# Patient Record
Sex: Female | Born: 1970 | Race: Black or African American | Hispanic: No | Marital: Married | Smoking: Never smoker
Health system: Southern US, Community
[De-identification: ages and names within clinical notes are randomized; demographics above are authoritative.]

## PROBLEM LIST (undated history)

## (undated) DIAGNOSIS — A630 Anogenital (venereal) warts: Secondary | ICD-10-CM

## (undated) DIAGNOSIS — R030 Elevated blood-pressure reading, without diagnosis of hypertension: Secondary | ICD-10-CM

## (undated) DIAGNOSIS — E042 Nontoxic multinodular goiter: Secondary | ICD-10-CM

## (undated) DIAGNOSIS — Q7649 Other congenital malformations of spine, not associated with scoliosis: Secondary | ICD-10-CM

## (undated) DIAGNOSIS — Z8619 Personal history of other infectious and parasitic diseases: Secondary | ICD-10-CM

## (undated) DIAGNOSIS — I10 Essential (primary) hypertension: Secondary | ICD-10-CM

## (undated) DIAGNOSIS — Z531 Procedure and treatment not carried out because of patient's decision for reasons of belief and group pressure: Secondary | ICD-10-CM

## (undated) DIAGNOSIS — J302 Other seasonal allergic rhinitis: Secondary | ICD-10-CM

## (undated) DIAGNOSIS — Z8669 Personal history of other diseases of the nervous system and sense organs: Secondary | ICD-10-CM

## (undated) DIAGNOSIS — T7840XA Allergy, unspecified, initial encounter: Secondary | ICD-10-CM

## (undated) HISTORY — DX: Personal history of other diseases of the nervous system and sense organs: Z86.69

## (undated) HISTORY — DX: Other congenital malformations of spine, not associated with scoliosis: Q76.49

## (undated) HISTORY — DX: Elevated blood-pressure reading, without diagnosis of hypertension: R03.0

## (undated) HISTORY — DX: Anogenital (venereal) warts: A63.0

## (undated) HISTORY — DX: Essential (primary) hypertension: I10

## (undated) HISTORY — DX: Nontoxic multinodular goiter: E04.2

## (undated) HISTORY — PX: WISDOM TOOTH EXTRACTION: SHX21

## (undated) HISTORY — DX: Allergy, unspecified, initial encounter: T78.40XA

## (undated) HISTORY — DX: Other seasonal allergic rhinitis: J30.2

## (undated) HISTORY — DX: Personal history of other infectious and parasitic diseases: Z86.19

## (undated) HISTORY — PX: TUBAL LIGATION: SHX77

---
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
## 2001-09-30 ENCOUNTER — Other Ambulatory Visit: Admission: RE | Admit: 2001-09-30 | Discharge: 2001-09-30 | Payer: Self-pay | Admitting: *Deleted

## 2002-10-08 ENCOUNTER — Other Ambulatory Visit: Admission: RE | Admit: 2002-10-08 | Discharge: 2002-10-08 | Payer: Self-pay | Admitting: Obstetrics & Gynecology

## 2003-08-26 ENCOUNTER — Inpatient Hospital Stay (HOSPITAL_COMMUNITY): Admission: AD | Admit: 2003-08-26 | Discharge: 2003-08-27 | Payer: Self-pay | Admitting: Obstetrics and Gynecology

## 2003-09-12 ENCOUNTER — Encounter (INDEPENDENT_AMBULATORY_CARE_PROVIDER_SITE_OTHER): Payer: Self-pay

## 2003-09-12 ENCOUNTER — Inpatient Hospital Stay (HOSPITAL_COMMUNITY): Admission: AD | Admit: 2003-09-12 | Discharge: 2003-09-14 | Payer: Self-pay | Admitting: Obstetrics and Gynecology

## 2003-09-15 ENCOUNTER — Encounter: Admission: RE | Admit: 2003-09-15 | Discharge: 2003-10-15 | Payer: Self-pay | Admitting: Obstetrics and Gynecology

## 2003-10-16 ENCOUNTER — Encounter: Admission: RE | Admit: 2003-10-16 | Discharge: 2003-11-15 | Payer: Self-pay | Admitting: Obstetrics and Gynecology

## 2003-11-14 ENCOUNTER — Other Ambulatory Visit: Admission: RE | Admit: 2003-11-14 | Discharge: 2003-11-14 | Payer: Self-pay | Admitting: Obstetrics and Gynecology

## 2003-12-16 ENCOUNTER — Encounter: Admission: RE | Admit: 2003-12-16 | Discharge: 2004-01-15 | Payer: Self-pay | Admitting: Obstetrics and Gynecology

## 2004-01-16 ENCOUNTER — Encounter: Admission: RE | Admit: 2004-01-16 | Discharge: 2004-02-15 | Payer: Self-pay | Admitting: Obstetrics and Gynecology

## 2004-03-15 ENCOUNTER — Encounter: Admission: RE | Admit: 2004-03-15 | Discharge: 2004-04-14 | Payer: Self-pay | Admitting: Obstetrics and Gynecology

## 2004-07-31 ENCOUNTER — Ambulatory Visit: Payer: Self-pay | Admitting: Family Medicine

## 2005-03-25 ENCOUNTER — Encounter: Admission: RE | Admit: 2005-03-25 | Discharge: 2005-03-25 | Payer: Self-pay | Admitting: Obstetrics and Gynecology

## 2005-09-24 ENCOUNTER — Emergency Department (HOSPITAL_COMMUNITY): Admission: EM | Admit: 2005-09-24 | Discharge: 2005-09-24 | Payer: Self-pay | Admitting: Family Medicine

## 2005-09-30 ENCOUNTER — Emergency Department (HOSPITAL_COMMUNITY): Admission: EM | Admit: 2005-09-30 | Discharge: 2005-09-30 | Payer: Self-pay | Admitting: Family Medicine

## 2006-11-21 ENCOUNTER — Ambulatory Visit (HOSPITAL_COMMUNITY): Admission: RE | Admit: 2006-11-21 | Discharge: 2006-11-21 | Payer: Self-pay | Admitting: Internal Medicine

## 2008-05-05 ENCOUNTER — Encounter: Admission: RE | Admit: 2008-05-05 | Discharge: 2008-05-05 | Payer: Self-pay | Admitting: Internal Medicine

## 2008-05-23 ENCOUNTER — Emergency Department (HOSPITAL_COMMUNITY): Admission: EM | Admit: 2008-05-23 | Discharge: 2008-05-23 | Payer: Self-pay | Admitting: Emergency Medicine

## 2010-04-18 ENCOUNTER — Ambulatory Visit (HOSPITAL_COMMUNITY): Admission: RE | Admit: 2010-04-18 | Discharge: 2010-04-18 | Payer: Self-pay | Admitting: Obstetrics and Gynecology

## 2010-05-09 ENCOUNTER — Ambulatory Visit (HOSPITAL_COMMUNITY): Admission: RE | Admit: 2010-05-09 | Discharge: 2010-05-09 | Payer: Self-pay | Admitting: Obstetrics and Gynecology

## 2010-06-27 ENCOUNTER — Ambulatory Visit (HOSPITAL_COMMUNITY): Admission: RE | Admit: 2010-06-27 | Discharge: 2010-06-27 | Payer: Self-pay | Admitting: Obstetrics and Gynecology

## 2010-08-01 ENCOUNTER — Ambulatory Visit (HOSPITAL_COMMUNITY): Admission: RE | Admit: 2010-08-01 | Discharge: 2010-08-01 | Payer: Self-pay | Admitting: Obstetrics and Gynecology

## 2010-09-20 ENCOUNTER — Encounter (INDEPENDENT_AMBULATORY_CARE_PROVIDER_SITE_OTHER): Payer: Self-pay | Admitting: Obstetrics and Gynecology

## 2010-09-20 ENCOUNTER — Inpatient Hospital Stay (HOSPITAL_COMMUNITY): Admission: AD | Admit: 2010-09-20 | Discharge: 2010-09-22 | Payer: Self-pay | Admitting: Obstetrics and Gynecology

## 2010-09-25 ENCOUNTER — Encounter
Admission: RE | Admit: 2010-09-25 | Discharge: 2010-10-25 | Payer: Self-pay | Source: Home / Self Care | Admitting: Obstetrics and Gynecology

## 2010-10-26 ENCOUNTER — Encounter
Admission: RE | Admit: 2010-10-26 | Discharge: 2010-11-25 | Payer: Self-pay | Source: Home / Self Care | Attending: Obstetrics and Gynecology | Admitting: Obstetrics and Gynecology

## 2010-11-06 ENCOUNTER — Encounter
Admission: RE | Admit: 2010-11-06 | Discharge: 2010-11-06 | Payer: Self-pay | Source: Home / Self Care | Attending: Obstetrics and Gynecology | Admitting: Obstetrics and Gynecology

## 2010-11-26 ENCOUNTER — Encounter
Admission: RE | Admit: 2010-11-26 | Discharge: 2010-12-25 | Payer: Self-pay | Source: Home / Self Care | Attending: Obstetrics and Gynecology | Admitting: Obstetrics and Gynecology

## 2010-12-16 ENCOUNTER — Encounter: Payer: Self-pay | Admitting: Obstetrics and Gynecology

## 2010-12-27 ENCOUNTER — Encounter (HOSPITAL_COMMUNITY)
Admission: RE | Admit: 2010-12-27 | Discharge: 2010-12-27 | Disposition: A | Discharge status: Home / Self Care | Payer: Federal, State, Local not specified - PPO | Source: Ambulatory Visit | Attending: Obstetrics and Gynecology | Admitting: Obstetrics and Gynecology

## 2010-12-27 DIAGNOSIS — O923 Agalactia: Secondary | ICD-10-CM | POA: Insufficient documentation

## 2011-02-06 LAB — CBC
Hemoglobin: 10.4 g/dL — ABNORMAL LOW (ref 12.0–15.0)
Platelets: 223 10*3/uL (ref 150–400)
RBC: 3.82 MIL/uL — ABNORMAL LOW (ref 3.87–5.11)
RBC: 4.27 MIL/uL (ref 3.87–5.11)
WBC: 9 10*3/uL (ref 4.0–10.5)

## 2011-02-06 LAB — URIC ACID: Uric Acid, Serum: 3.8 mg/dL (ref 2.4–7.0)

## 2011-02-06 LAB — COMPREHENSIVE METABOLIC PANEL
ALT: 22 U/L (ref 0–35)
AST: 35 U/L (ref 0–37)
CO2: 23 mEq/L (ref 19–32)
Chloride: 105 mEq/L (ref 96–112)
GFR calc Af Amer: >60 mL/min (ref >60)
GFR calc non Af Amer: >60 mL/min (ref >60)
Sodium: 134 mEq/L — ABNORMAL LOW (ref 135–145)
Total Bilirubin: 0.7 mg/dL (ref 0.3–1.2)

## 2011-02-06 LAB — RPR: RPR Ser Ql: NONREACTIVE

## 2011-02-06 LAB — RUBELLA SCREEN: Rubella: 3.1 IU/mL

## 2011-02-25 ENCOUNTER — Encounter (HOSPITAL_COMMUNITY)
Admission: RE | Admit: 2011-02-25 | Discharge: 2011-02-25 | Disposition: A | Discharge status: Home / Self Care | Payer: Federal, State, Local not specified - PPO | Source: Ambulatory Visit | Attending: Obstetrics and Gynecology | Admitting: Obstetrics and Gynecology

## 2011-02-25 DIAGNOSIS — O923 Agalactia: Secondary | ICD-10-CM | POA: Insufficient documentation

## 2011-03-28 ENCOUNTER — Encounter (HOSPITAL_COMMUNITY)
Admission: RE | Admit: 2011-03-28 | Discharge: 2011-03-28 | Disposition: A | Discharge status: Home / Self Care | Payer: Federal, State, Local not specified - PPO | Source: Ambulatory Visit | Attending: Obstetrics and Gynecology | Admitting: Obstetrics and Gynecology

## 2011-03-28 DIAGNOSIS — O923 Agalactia: Secondary | ICD-10-CM | POA: Insufficient documentation
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
## 2011-04-12 NOTE — H&P (Signed)
   NAME:  Jodi Young, Jodi Young                       ACCOUNT NO.:  [id number]   MEDICAL RECORD NO.:  [id number]                   PATIENT TYPE:  MAT   LOCATION:  [facility]                                 FACILITY:  [facility]   PHYSICIAN:  Lenoard Aden, M.D.             DATE OF BIRTH:  10/10/1971   DATE OF ADMISSION:  09/12/2003  DATE OF DISCHARGE:                                HISTORY & PHYSICAL   CHIEF COMPLAINT:  Spontaneous rupture of membranes at 4 a.m.   HISTORY OF PRESENT ILLNESS:  The patient is a 40 year old African-American  female, G1, P0, EDD of October 19, 2003, who presents at 34-5/7 weeks, who  presents with spontaneous rupture of membranes at 4 a.m.   PAST MEDICAL HISTORY:  Remarkable for cryosurgery, history of HPV,  questionable history of a fibroid, history of cone biopsy.  No other medical  or surgical hospitalizations noted.  Family history of insulin-dependent  diabetes.  Personal history of nephrolithiasis and migraine headaches.  History of hypertension.   PRENATAL LABORATORY DATA:  Blood type is O positive, Rh antibody negative.  Rubella immune.  Hemoglobin electrophoresis within normal limits.   The patient has presented approximately at 31-32 weeks with preterm labor.  She has received betamethasone on, I believe, October 4 and October 5.   PHYSICAL EXAMINATION:  GENERAL:  She was an uncomfortable-appearing African-  American female in no acute distress.  VITAL SIGNS:  Blood pressure 150/112.  HEENT:  Normal.  CHEST:  Lungs clear.  CARDIAC:  Regular rhythm.  ABDOMEN:  Soft, gravid, nontender.  No right upper quadrant tenderness  noted.  PELVIC:  Cervix 2-3, 100%, vertex, 0 station.  EXTREMITIES:  DTRs 2+, no evidence of clonus.   IMPRESSION:  1. A 34-5/7 week intrauterine pregnancy.  2. Spontaneous rupture of membranes with reassuring fetal heart rate     tracing, contractions every two minutes, neonatal intensive care unit     notified.  Group B  Streptococcus reported as negative.  Betamethasone     given.  3. Pregnancy-induced hypertension versus preeclampsia.   PLAN:  1. Check PIH panel.  2.     Admit to labor and delivery.  3. Recommend epidural and anticipate attempts at vaginal delivery.  4. Possible magnesium tocolysis.                                               Lenoard Aden, M.D.    RJT/MEDQ  D:  09/12/2003  T:  09/12/2003  Job:  [id number]

## 2011-04-28 ENCOUNTER — Encounter (HOSPITAL_COMMUNITY)
Admission: RE | Admit: 2011-04-28 | Discharge: 2011-04-28 | Disposition: A | Discharge status: Home / Self Care | Payer: Federal, State, Local not specified - PPO | Source: Ambulatory Visit | Attending: Obstetrics and Gynecology | Admitting: Obstetrics and Gynecology

## 2011-04-28 DIAGNOSIS — O923 Agalactia: Secondary | ICD-10-CM | POA: Insufficient documentation

## 2011-05-30 ENCOUNTER — Encounter (HOSPITAL_COMMUNITY)
Admission: RE | Admit: 2011-05-30 | Discharge: 2011-05-30 | Disposition: A | Discharge status: Home / Self Care | Payer: Federal, State, Local not specified - PPO | Source: Ambulatory Visit | Attending: Obstetrics and Gynecology | Admitting: Obstetrics and Gynecology

## 2011-05-30 DIAGNOSIS — O923 Agalactia: Secondary | ICD-10-CM | POA: Insufficient documentation

## 2011-07-01 ENCOUNTER — Encounter (HOSPITAL_COMMUNITY)
Admission: RE | Admit: 2011-07-01 | Discharge: 2011-07-01 | Disposition: A | Discharge status: Home / Self Care | Payer: Federal, State, Local not specified - PPO | Source: Ambulatory Visit | Attending: Obstetrics and Gynecology | Admitting: Obstetrics and Gynecology

## 2011-07-01 DIAGNOSIS — O923 Agalactia: Secondary | ICD-10-CM | POA: Insufficient documentation

## 2011-11-11 ENCOUNTER — Ambulatory Visit (HOSPITAL_COMMUNITY)
Admission: RE | Admit: 2011-11-11 | Discharge: 2011-11-11 | Disposition: A | Discharge status: Home / Self Care | Payer: Federal, State, Local not specified - PPO | Source: Ambulatory Visit | Attending: Obstetrics and Gynecology | Admitting: Obstetrics and Gynecology

## 2011-11-11 DIAGNOSIS — M79609 Pain in unspecified limb: Secondary | ICD-10-CM

## 2011-11-11 DIAGNOSIS — R52 Pain, unspecified: Secondary | ICD-10-CM

## 2011-11-11 NOTE — Progress Notes (Signed)
Right lower extremity venous duplex completed at 13:15.  Preliminary report is negative for DVT, SVT, or a Baker's cyst. Jodi Young 11/11/2011, 1:58 PM

## 2011-12-17 ENCOUNTER — Ambulatory Visit: Payer: Federal, State, Local not specified - PPO | Admitting: Family Medicine

## 2012-01-13 ENCOUNTER — Encounter: Payer: Self-pay | Admitting: Family Medicine

## 2012-01-13 ENCOUNTER — Ambulatory Visit (INDEPENDENT_AMBULATORY_CARE_PROVIDER_SITE_OTHER): Payer: Federal, State, Local not specified - PPO | Admitting: Family Medicine

## 2012-01-13 DIAGNOSIS — G43829 Menstrual migraine, not intractable, without status migrainosus: Secondary | ICD-10-CM | POA: Insufficient documentation

## 2012-01-13 DIAGNOSIS — Z23 Encounter for immunization: Secondary | ICD-10-CM

## 2012-01-13 DIAGNOSIS — E041 Nontoxic single thyroid nodule: Secondary | ICD-10-CM | POA: Insufficient documentation

## 2012-01-13 DIAGNOSIS — J302 Other seasonal allergic rhinitis: Secondary | ICD-10-CM

## 2012-01-13 DIAGNOSIS — J309 Allergic rhinitis, unspecified: Secondary | ICD-10-CM

## 2012-01-13 DIAGNOSIS — E042 Nontoxic multinodular goiter: Secondary | ICD-10-CM

## 2012-01-13 DIAGNOSIS — Z8669 Personal history of other diseases of the nervous system and sense organs: Secondary | ICD-10-CM

## 2012-01-13 DIAGNOSIS — R030 Elevated blood-pressure reading, without diagnosis of hypertension: Secondary | ICD-10-CM

## 2012-01-13 NOTE — Progress Notes (Signed)
Subjective:    Patient ID: Jodi Young, female    DOB: 06-11-1971, 41 y.o.   MRN: [id number]  HPI CC: new pt establish  GYN Dr. Norberto Sorenson well woman there.  Elevated BP without dx HTN - today 130/90.  No HA, vision changes, CP/tightness, SOB, leg swelling. Tends to get migraine HA during menstrual cycle.  Preventative: Well woman - Dr. Elgie Collard.  Recently normal pap smears.  Mammogram - normal.  currently breast feeding. Tetanus - unsure, thinks 2003.  Tdap today. Recent blood work thinks done 2011, will bring copy for me.  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  Some walking, recently joined gym Diet: good water daily, vegetables daily  Medications and allergies reviewed and updated in chart.  Past histories reviewed and updated if relevant as below. There is no problem list on file for this patient.  Past Medical History  Diagnosis Date  . History of chicken pox   . Genital warts   . Elevated blood pressure reading without diagnosis of hypertension   . Hx of migraines     menstrual  . Multiple thyroid nodules     benign, followed by endo, last [place] 2008   Past Surgical History  Procedure Date  . Cesarean section 2011  . Wisdom tooth extraction    History  Substance Use Topics  . Smoking status: Never Smoker   . Smokeless tobacco: Never Used  . Alcohol Use: Yes     Rarely   Family History  Problem Relation Age of Onset  . Hypertension Father   . Alcohol abuse Maternal Grandmother   . Alcohol abuse Maternal Grandfather   . Alcohol abuse Paternal Grandmother   . Diabetes Paternal Grandmother   . COPD Paternal Grandmother   . Alcohol abuse Paternal Grandfather   . Coronary artery disease Neg Hx   . Stroke Neg Hx   . Cancer Neg Hx    No Known  Allergies No current outpatient prescriptions on file prior to visit.     Review of Systems  Constitutional: Negative for fever, chills, activity change, appetite change, fatigue and unexpected weight change.  HENT: Negative for hearing loss and neck pain.   Eyes: Negative for visual disturbance.  Respiratory: Positive for cough (URI 11/2011). Negative for chest tightness, shortness of breath and wheezing.   Cardiovascular: Negative for chest pain, palpitations and leg swelling.  Gastrointestinal: Negative for nausea, vomiting, abdominal pain, diarrhea, constipation, blood in stool and abdominal distention.  Genitourinary: Negative for hematuria and difficulty urinating.  Musculoskeletal: Negative for myalgias and arthralgias.  Skin: Negative for rash.  Neurological: Negative for dizziness, seizures, syncope and headaches.  Hematological: Does not bruise/bleed easily.  Psychiatric/Behavioral: Negative for dysphoric mood. The patient is not nervous/anxious.        Objective:   Physical Exam  Nursing note and vitals reviewed. Constitutional: She is oriented to person, place, and time. She appears well-developed and well-nourished. No distress.  HENT:  Head: Normocephalic and atraumatic.  Right Ear: External ear normal.  Left Ear: External ear normal.  Nose: Nose normal.  Mouth/Throat: Oropharynx is clear and moist. No oropharyngeal exudate.  Eyes: Conjunctivae and EOM are normal. Pupils are equal, round, and reactive to light. No scleral icterus.  Neck: Normal range of motion. Neck supple. No thyromegaly present.  Cardiovascular:  Normal rate, regular rhythm, normal heart sounds and intact distal pulses.   No murmur heard. Pulses:      Radial pulses are 2+ on the right side, and 2+ on the left side.  Pulmonary/Chest: Effort normal and breath sounds normal. No respiratory distress. She has no wheezes. She has no rales.  Abdominal: Soft. Bowel sounds are normal. She exhibits no  distension and no mass. There is no tenderness. There is no rebound and no guarding.  Musculoskeletal: Normal range of motion. She exhibits no edema.       No palpable cords  Lymphadenopathy:    She has no cervical adenopathy.  Neurological: She is alert and oriented to person, place, and time.       CN grossly intact, station and gait intact  Skin: Skin is warm and dry. No rash noted.  Psychiatric: She has a normal mood and affect. Her behavior is normal. Judgment and thought content normal.      Assessment & Young:

## 2012-01-13 NOTE — Patient Instructions (Signed)
Keep eye on blood pressure.  If consistently >140/90, please return for recheck.   Good to see you today, call us with questions. Return in 1-2 years or as needed. Schedule follow up with endocrinology. Tdap today.

## 2012-01-13 NOTE — Progress Notes (Signed)
Addended by: Josph Macho A on: 01/13/2012 11:35 AM   Modules accepted: Orders

## 2012-01-13 NOTE — Assessment & Plan Note (Signed)
rec schedule f/u with endo.

## 2012-01-13 NOTE — Assessment & Plan Note (Addendum)
Elevated today, however new doc and new office. Recommend keep track of bp once a week at home or pharmacy, if consistently >140/90, to come in for evaluation. Discussed avoiding sodium, increasing potassium rich diet. Pt thinks she has had recent blood work, asked her to bring in to review.

## 2012-07-30 ENCOUNTER — Other Ambulatory Visit: Payer: Self-pay | Admitting: Internal Medicine

## 2012-07-30 DIAGNOSIS — E049 Nontoxic goiter, unspecified: Secondary | ICD-10-CM

## 2012-08-06 ENCOUNTER — Ambulatory Visit (HOSPITAL_COMMUNITY)
Admission: RE | Admit: 2012-08-06 | Discharge: 2012-08-06 | Disposition: A | Discharge status: Home / Self Care | Payer: Federal, State, Local not specified - PPO | Source: Ambulatory Visit | Attending: Internal Medicine | Admitting: Internal Medicine

## 2012-08-06 DIAGNOSIS — E041 Nontoxic single thyroid nodule: Secondary | ICD-10-CM | POA: Insufficient documentation

## 2012-08-06 DIAGNOSIS — E049 Nontoxic goiter, unspecified: Secondary | ICD-10-CM

## 2013-05-21 ENCOUNTER — Encounter (HOSPITAL_COMMUNITY): Payer: Self-pay

## 2013-05-21 ENCOUNTER — Emergency Department (HOSPITAL_COMMUNITY)
Admission: EM | Admit: 2013-05-21 | Discharge: 2013-05-21 | Disposition: A | Discharge status: Home / Self Care | Payer: Federal, State, Local not specified - PPO | Source: Home / Self Care

## 2013-05-21 DIAGNOSIS — J309 Allergic rhinitis, unspecified: Secondary | ICD-10-CM

## 2013-05-21 DIAGNOSIS — J4520 Mild intermittent asthma, uncomplicated: Secondary | ICD-10-CM

## 2013-05-21 DIAGNOSIS — J45909 Unspecified asthma, uncomplicated: Secondary | ICD-10-CM

## 2013-05-21 DIAGNOSIS — J302 Other seasonal allergic rhinitis: Secondary | ICD-10-CM

## 2013-05-21 MED ORDER — FEXOFENADINE HCL 180 MG PO TABS: 180.0000 mg | ORAL_TABLET | Freq: Every day | ORAL | Status: DC

## 2013-05-21 MED ORDER — FLUTICASONE PROPIONATE 50 MCG/ACT NA SUSP: 2.0000 | Freq: Every day | NASAL | Status: DC

## 2013-05-21 MED ORDER — ALBUTEROL SULFATE HFA 108 (90 BASE) MCG/ACT IN AERS: 2.0000 | INHALATION_SPRAY | RESPIRATORY_TRACT | Status: DC | PRN

## 2013-05-21 NOTE — ED Notes (Signed)
Reviewed use of MDI

## 2013-05-21 NOTE — ED Provider Notes (Signed)
Medical screening examination/treatment/procedure(s) were performed by non-physician practitioner and as supervising physician I was immediately available for consultation/collaboration.  Leslee Home, M.D.  Reuben Likes, MD 05/21/13 [phone]

## 2013-05-21 NOTE — ED Notes (Signed)
C/o URI syx for 1 week, getting worse. Cough, chest soreness, denies abnormal secretion

## 2013-05-21 NOTE — ED Provider Notes (Signed)
History    CSN: [id number] Arrival date & time 05/21/13  1312  None    Chief Complaint  Patient presents with  . URI   (Consider location/radiation/quality/duration/timing/severity/associated sxs/prior Treatment) HPI Comments: 42 year old female presents with sneezing, cough, nasal congestion, nasal drainage, PND and scratchy throat. She states that occasionally she has anterior chest pain associated with cough. Denies fever, chills or GI symptoms. She is not taking any medications. She has a known history of seasonal allergies.  Past Medical History  Diagnosis Date  . History of chicken pox   . Genital warts   . Elevated blood pressure reading without diagnosis of hypertension   . Hx of migraines     menstrual  . Multiple thyroid nodules     benign, followed by endo, last [place] 2008  . Seasonal allergies    Past Surgical History  Procedure Laterality Date  . Cesarean section  2011  . Wisdom tooth extraction     Family History  Problem Relation Age of Onset  . Hypertension Father   . Alcohol abuse Maternal Grandmother   . Alcohol abuse Maternal Grandfather   . Alcohol abuse Paternal Grandmother   . Diabetes Paternal Grandmother   . COPD Paternal Grandmother   . Alcohol abuse Paternal Grandfather   . Coronary artery disease Neg Hx   . Stroke Neg Hx   . Cancer Neg Hx    History  Substance Use Topics  . Smoking status: Never Smoker   . Smokeless tobacco: Never Used  . Alcohol Use: Yes     Comment: Rarely   OB History   Grav Para Term Preterm Abortions TAB SAB Ect Mult Living                 Review of Systems  Constitutional: Negative for fever, chills, activity change, appetite change and fatigue.  HENT: Positive for congestion, sore throat, rhinorrhea and postnasal drip. Negative for ear pain, facial swelling, neck pain and neck stiffness.   Eyes: Negative.   Respiratory: Positive for cough. Negative for shortness of breath.   Cardiovascular: Negative.    Gastrointestinal: Negative.   Skin: Negative for pallor and rash.  Neurological: Negative.     Allergies  Review of patient's allergies indicates no known allergies.  Home Medications   Current Outpatient Rx  Name  Route  Sig  Dispense  Refill  . albuterol (PROVENTIL HFA;VENTOLIN HFA) 108 (90 BASE) MCG/ACT inhaler   Inhalation   Inhale 2 puffs into the lungs every 4 (four) hours as needed for wheezing.   1 Inhaler   0   . fexofenadine (ALLEGRA) 180 MG tablet   Oral   Take 1 tablet (180 mg total) by mouth daily.   30 tablet   0   . fluticasone (FLONASE) 50 MCG/ACT nasal spray   Nasal   Place 2 sprays into the nose daily.   16 g   2    BP 138/97  Pulse 90  Temp(Src) 98.1 F (36.7 C) (Oral)  Resp 18  SpO2 98% Physical Exam  Nursing note and vitals reviewed. Constitutional: She is oriented to person, place, and time. She appears well-developed and well-nourished. No distress.  HENT:  Mouth/Throat: No oropharyngeal exudate.  Bilateral TMs are normal Oropharynx with minor erythema and light, clear PND. No exudates or swelling.  Eyes: Conjunctivae are normal. Pupils are equal, round, and reactive to light.  Neck: Normal range of motion. Neck supple.  Cardiovascular: Normal rate, regular rhythm and normal heart  sounds.   Pulmonary/Chest: Effort normal and breath sounds normal. No respiratory distress.  Faint inspiratory wheeze. Slight prolonged expiratory phase. No expiratory wheezes appreciated.  Musculoskeletal: Normal range of motion. She exhibits no edema.  Lymphadenopathy:    She has no cervical adenopathy.  Neurological: She is alert and oriented to person, place, and time.  Skin: Skin is warm and dry. No rash noted.  Psychiatric: She has a normal mood and affect.    ED Course  Procedures (including critical care time) Labs Reviewed - No data to display No results found. 1. Seasonal allergies   2. Allergic rhinitis due to allergen   3. RAD (reactive  airway disease) with wheezing, mild intermittent, uncomplicated     MDM  Allegra 180 mg daily Fluticasone nasal spray as directed Albuterol HFA 2 puffs every 4-6 hours when necessary cough and wheeze Drink plenty of fluids stay well hydrated May use Robitussin-DM as needed for cough  Hayden Rasmussen, NP 05/21/13 1441

## 2013-05-25 ENCOUNTER — Ambulatory Visit: Payer: Federal, State, Local not specified - PPO | Admitting: Family Medicine

## 2013-10-07 ENCOUNTER — Encounter: Payer: Self-pay | Admitting: Internal Medicine

## 2013-10-07 ENCOUNTER — Encounter: Payer: Self-pay | Admitting: *Deleted

## 2013-10-07 ENCOUNTER — Ambulatory Visit (INDEPENDENT_AMBULATORY_CARE_PROVIDER_SITE_OTHER): Payer: Federal, State, Local not specified - PPO | Admitting: Internal Medicine

## 2013-10-07 VITALS — BP 132/86 | HR 95 | Temp 99.5°F | Wt 150.5 lb

## 2013-10-07 DIAGNOSIS — J029 Acute pharyngitis, unspecified: Secondary | ICD-10-CM

## 2013-10-07 LAB — POCT RAPID STREP A (OFFICE): Rapid Strep A Screen: NEGATIVE

## 2013-10-07 MED ORDER — AMOXICILLIN 500 MG PO CAPS: 500.0000 mg | ORAL_CAPSULE | Freq: Three times a day (TID) | ORAL | Status: DC

## 2013-10-07 NOTE — Progress Notes (Signed)
HPI  Pt presents to the clinic today with c/o fever, sore throat, chills and body aches. This started last night. She does not have a cough. She has not taken anything OTC. She has had sick contacts. She does have a history of allergies.  Review of Systems      Past Medical History  Diagnosis Date  . History of chicken pox   . Genital warts   . Elevated blood pressure reading without diagnosis of hypertension   . Hx of migraines     menstrual  . Multiple thyroid nodules     benign, followed by endo, last [place] 2008  . Seasonal allergies     Family History  Problem Relation Age of Onset  . Hypertension Father   . Alcohol abuse Maternal Grandmother   . Alcohol abuse Maternal Grandfather   . Alcohol abuse Paternal Grandmother   . Diabetes Paternal Grandmother   . COPD Paternal Grandmother   . Alcohol abuse Paternal Grandfather   . Coronary artery disease Neg Hx   . Stroke Neg Hx   . Cancer Neg Hx     History   Social History  . Marital Status: Married    Spouse Name: N/A    Number of Children: N/A  . Years of Education: N/A   Occupational History  . Not on file.   Social History Main Topics  . Smoking status: Never Smoker   . Smokeless tobacco: Never Used  . Alcohol Use: Yes     Comment: Rarely  . Drug Use: No  . Sexual Activity: Not on file   Other Topics Concern  . 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  Some walking, recently joined gym   Diet: good water daily, vegetables daily    No Known Allergies   Constitutional: Positive headache, fatigue and fever. Denies abrupt weight changes.  HEENT:  Positive sore throat. Denies eye redness, eye pain, pressure behind the eyes, facial pain, nasal congestion,  ear pain, ringing in the ears, wax buildup, runny nose or bloody nose. Respiratory: Positive cough. Denies difficulty breathing or shortness of breath.  Cardiovascular: Denies chest pain, chest tightness, palpitations or swelling in the hands or feet.   No other specific complaints in a complete review of systems (except as listed in HPI above).  Objective:   BP 132/86  Pulse 95  Temp(Src) 99.5 F (37.5 C) (Oral)  Wt 150 lb 8 oz (68.266 kg)  SpO2 98%  LMP 09/15/2013 Wt Readings from Last 3 Encounters:  10/07/13 150 lb 8 oz (68.266 kg)  01/13/12 148 lb 8 oz (67.359 kg)     General: Appears her stated age, well developed, well nourished in NAD. HEENT: Head: normal shape and size; Eyes: sclera white, no icterus, conjunctiva pink, PERRLA and EOMs intact; Ears: Tm's gray and intact, normal light reflex; Nose: mucosa pink and moist, septum midline; Throat/Mouth: + PND. Teeth present, mucosa erythematous and moist, no exudate noted but blisters noted on bilateral tonsillar pillars.  Neck: Mild tonsillar lymphadenopathy. Neck supple, trachea midline. No massses, lumps or thyromegaly present.  Cardiovascular: Normal rate and rhythm. S1,S2 noted.  No murmur, rubs or gallops noted. No JVD or BLE edema. No carotid bruits noted. Pulmonary/Chest:  Normal effort and positive vesicular breath sounds. No respiratory distress. No wheezes, rales or ronchi noted.      Assessment & Plan:   Acute pharyngitis:  Get some rest and drink plenty of water Do salt water gargles for the sore throat RST- negative but given fever and the way her throat looks, will treat with Amoxil TID x 10 days Ibuprofen for pain/fevr  RTC as needed or if symptoms persist.

## 2013-10-07 NOTE — Progress Notes (Signed)
Pre-visit discussion using our clinic review tool. No additional management support is needed unless otherwise documented below in the visit note.  

## 2013-10-07 NOTE — Patient Instructions (Signed)

## 2013-10-17 ENCOUNTER — Other Ambulatory Visit: Payer: Self-pay | Admitting: Family Medicine

## 2013-10-17 DIAGNOSIS — E042 Nontoxic multinodular goiter: Secondary | ICD-10-CM

## 2013-10-17 DIAGNOSIS — Z0000 Encounter for general adult medical examination without abnormal findings: Secondary | ICD-10-CM

## 2013-10-18 ENCOUNTER — Other Ambulatory Visit: Payer: Federal, State, Local not specified - PPO

## 2013-10-18 ENCOUNTER — Ambulatory Visit: Payer: Federal, State, Local not specified - PPO

## 2013-10-18 ENCOUNTER — Other Ambulatory Visit (INDEPENDENT_AMBULATORY_CARE_PROVIDER_SITE_OTHER): Payer: Federal, State, Local not specified - PPO

## 2013-10-18 DIAGNOSIS — R6889 Other general symptoms and signs: Secondary | ICD-10-CM

## 2013-10-18 DIAGNOSIS — D509 Iron deficiency anemia, unspecified: Secondary | ICD-10-CM

## 2013-10-18 DIAGNOSIS — Z0000 Encounter for general adult medical examination without abnormal findings: Secondary | ICD-10-CM

## 2013-10-18 DIAGNOSIS — E042 Nontoxic multinodular goiter: Secondary | ICD-10-CM

## 2013-10-18 LAB — COMPREHENSIVE METABOLIC PANEL
Alkaline Phosphatase: 56 U/L (ref 39–117)
CO2: 27 mEq/L (ref 19–32)
Creatinine, Ser: 0.8 mg/dL (ref 0.4–1.2)
GFR: 105.81 mL/min (ref >60.00)
Glucose, Bld: 86 mg/dL (ref 70–99)
Sodium: 136 mEq/L (ref 135–145)
Total Bilirubin: 0.2 mg/dL — ABNORMAL LOW (ref 0.3–1.2)
Total Protein: 7.3 g/dL (ref 6.0–8.3)

## 2013-10-18 LAB — LIPID PANEL
HDL: 47.3 mg/dL (ref >39.00)
Total CHOL/HDL Ratio: 3
Triglycerides: 58 mg/dL (ref 0.0–149.0)
VLDL: 11.6 mg/dL (ref 0.0–40.0)

## 2013-10-18 LAB — FERRITIN: Ferritin: 4.5 ng/mL — ABNORMAL LOW (ref 10.0–291.0)

## 2013-10-18 LAB — CBC WITH DIFFERENTIAL/PLATELET
Basophils Absolute: 0 10*3/uL (ref 0.0–0.1)
Basophils Relative: 0.5 % (ref 0.0–3.0)
Eosinophils Relative: 2.8 % (ref 0.0–5.0)
HCT: 29.6 % — ABNORMAL LOW (ref 36.0–46.0)
Hemoglobin: 9.6 g/dL — ABNORMAL LOW (ref 12.0–15.0)
Lymphocytes Relative: 34.2 % (ref 12.0–46.0)
Lymphs Abs: 1.9 10*3/uL (ref 0.7–4.0)
Monocytes Relative: 7.5 % (ref 3.0–12.0)
Neutro Abs: 3 10*3/uL (ref 1.4–7.7)
RBC: 4.09 Mil/uL (ref 3.87–5.11)

## 2013-10-18 LAB — TSH: TSH: 0.24 u[IU]/mL — ABNORMAL LOW (ref 0.35–5.50)

## 2013-10-18 LAB — IBC PANEL
Iron: 15 ug/dL — ABNORMAL LOW (ref 42–145)
Saturation Ratios: 3.5 % — ABNORMAL LOW (ref 20.0–50.0)
Transferrin: 304.1 mg/dL (ref 212.0–360.0)

## 2013-10-29 ENCOUNTER — Encounter: Payer: Self-pay | Admitting: Family Medicine

## 2013-10-29 ENCOUNTER — Ambulatory Visit (INDEPENDENT_AMBULATORY_CARE_PROVIDER_SITE_OTHER): Payer: Federal, State, Local not specified - PPO | Admitting: Family Medicine

## 2013-10-29 VITALS — BP 118/74 | HR 99 | Temp 98.8°F | Ht 58.5 in | Wt 152.0 lb

## 2013-10-29 DIAGNOSIS — E042 Nontoxic multinodular goiter: Secondary | ICD-10-CM

## 2013-10-29 DIAGNOSIS — D509 Iron deficiency anemia, unspecified: Secondary | ICD-10-CM

## 2013-10-29 DIAGNOSIS — Z23 Encounter for immunization: Secondary | ICD-10-CM

## 2013-10-29 DIAGNOSIS — Z0000 Encounter for general adult medical examination without abnormal findings: Secondary | ICD-10-CM | POA: Insufficient documentation

## 2013-10-29 NOTE — Progress Notes (Signed)
Subjective:    Patient ID: Jodi Young, female    DOB: 16-Dec-1970, 42 y.o.   MRN: [id number]  HPI CC: CPE  Hypothyroid - followed by endo Dr. Margaretmary Bayley.  Last visit was 02/2013.  Overdue for appt.  Following for goiter and thyroid nodules.  Will f/u with endo. Staying fatigued.  Not sleeping as much as she would like.  Not exercising regularly (walking maybe once a week).. Some depression/anxiety - feeling overwhelmed.  Does not want pharmacotherapy for this.  Declines counseling for now.  Will work on healthy lifestyle changes first.  Upcoming vacation.  Preventative:  Well woman - Dr. Elgie Collard. Recently normal pap smears/breast exams.  Mammogram - normal. currently breast feeding.  LMP - 10/11/2013.  Regular periods.  Heavy flow - goes through 1 pad/hour on first day, getting shorter.  H/o abnormal uterine shape. Tetanus - Tdap 2013.  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 Some walking, recently joined gym  Diet: good water daily, vegetables daily  Medications and allergies reviewed and updated in chart.  Past histories reviewed and updated if relevant as below. Patient Active Problem List   Diagnosis Date Noted  . Elevated blood pressure reading without diagnosis of hypertension   . Hx of migraines   . Multiple thyroid nodules    Past Medical History  Diagnosis Date  . History of chicken pox   . Genital warts   . Elevated blood pressure reading without diagnosis of hypertension   . Hx of migraines     menstrual  . Multiple thyroid nodules     benign, followed by endo, last [place] 2008  . Seasonal allergies    Past Surgical History  Procedure Laterality Date  . Cesarean section  2011  . Wisdom tooth extraction     History  Substance  Use Topics  . Smoking status: Never Smoker   . Smokeless tobacco: Never Used  . Alcohol Use: Yes     Comment: Rarely   Family History  Problem Relation Age of Onset  . Hypertension Father   . Alcohol abuse Maternal Grandmother   . Alcohol abuse Maternal Grandfather   . Alcohol abuse Paternal Grandmother   . Diabetes Paternal Grandmother   . COPD Paternal Grandmother   . Alcohol abuse Paternal Grandfather   . Coronary artery disease Neg Hx   . Stroke Neg Hx   . Cancer Neg Hx    No Known Allergies Current Outpatient Prescriptions on File Prior to Visit  Medication Sig Dispense Refill  . levothyroxine (SYNTHROID, LEVOTHROID) 75 MCG tablet Take 75 mcg by mouth daily.       No current facility-administered medications on file prior to visit.     Review of Systems  Constitutional: Positive for fever. Negative for chills, activity change, appetite change, fatigue and unexpected weight change.  HENT: Positive for sore throat. Negative for hearing loss and sinus pressure.   Eyes: Negative for visual disturbance.  Respiratory: Positive for cough. Negative for chest tightness, shortness of breath and wheezing.   Cardiovascular: Negative for chest pain, palpitations and leg swelling.  Gastrointestinal: Positive for constipation. Negative for nausea, vomiting, abdominal pain, diarrhea, blood in stool and abdominal distention.  Genitourinary: Negative for hematuria and difficulty urinating.  Musculoskeletal: Negative for arthralgias,  myalgias and neck pain.  Skin: Negative for rash.  Neurological: Negative for dizziness, seizures, syncope and headaches.  Hematological: Negative for adenopathy. Does not bruise/bleed easily.  Psychiatric/Behavioral: Positive for dysphoric mood. The patient is nervous/anxious.        Objective:   Physical Exam  Nursing note and vitals reviewed. Constitutional: She is oriented to person, place, and time. She appears well-developed and well-nourished. No  distress.  HENT:  Head: Normocephalic and atraumatic.  Right Ear: External ear normal.  Left Ear: External ear normal.  Nose: Nose normal.  Mouth/Throat: Oropharynx is clear and moist. No oropharyngeal exudate.  Eyes: Conjunctivae and EOM are normal. Pupils are equal, round, and reactive to light. No scleral icterus.  Neck: Normal range of motion. Neck supple. No thyromegaly present.  Cardiovascular: Normal rate, regular rhythm, normal heart sounds and intact distal pulses.   No murmur heard. Pulses:      Radial pulses are 2+ on the right side, and 2+ on the left side.  Pulmonary/Chest: Effort normal and breath sounds normal. No respiratory distress. She has no wheezes. She has no rales.  Abdominal: Soft. Bowel sounds are normal. She exhibits no distension and no mass. There is no tenderness. There is no rebound and no guarding.  Musculoskeletal: Normal range of motion. She exhibits no edema.  Lymphadenopathy:    She has no cervical adenopathy.  Neurological: She is alert and oriented to person, place, and time.  CN grossly intact, station and gait intact  Skin: Skin is warm and dry. No rash noted.  Psychiatric: She has a normal mood and affect. Her behavior is normal. Judgment and thought content normal.       Assessment & Young:

## 2013-10-29 NOTE — Progress Notes (Signed)
Pre-visit discussion using our clinic review tool. No additional management support is needed unless otherwise documented below in the visit note.  

## 2013-10-29 NOTE — Assessment & Plan Note (Signed)
Preventative protocols reviewed and updated unless pt declined. Discussed healthy diet and lifestyle.  Flu shot today Blood work reviewed.

## 2013-10-29 NOTE — Addendum Note (Signed)
Addended by: Josph Macho A on: 10/29/2013 11:33 AM   Modules accepted: Orders

## 2013-10-29 NOTE — Assessment & Plan Note (Signed)
Anticipate due to menorrhagia. Provided with iron rich food diet. Start iron tablets 2 daily. rec update Dr. Cherly Hensen with new dx. Will route blood work to Dr. Cherly Hensen. Likely contributing to fatigue she endorses.

## 2013-10-29 NOTE — Patient Instructions (Addendum)
Flu shot today. You have iron deficiency anemia likely from heavy periods. I'd like to start 2 pills of iron daily - if you can tolerate take between meals and with a glass of orange juice or a vitamin C.  If not tolerated then just take with meals.  If some constipation, start stool softener (colace or docusate). Let Dr. Cherly Hensen know about your anemia. Let Dr. Chestine Spore know about your thyroid. We will send copy of labs to Dr. Chestine Spore and Dr. Cherly Hensen. Good to see you today, call us with questions.

## 2013-10-29 NOTE — Assessment & Plan Note (Signed)
Encouraged she contact Dr. Chestine Spore for titration of synthroid.

## 2013-11-03 ENCOUNTER — Other Ambulatory Visit: Payer: Self-pay

## 2013-11-03 DIAGNOSIS — Z1231 Encounter for screening mammogram for malignant neoplasm of breast: Secondary | ICD-10-CM

## 2013-11-04 ENCOUNTER — Telehealth: Payer: Self-pay | Admitting: Family Medicine

## 2013-11-04 ENCOUNTER — Other Ambulatory Visit: Payer: Self-pay | Admitting: Internal Medicine

## 2013-11-04 DIAGNOSIS — E042 Nontoxic multinodular goiter: Secondary | ICD-10-CM

## 2013-11-04 NOTE — Telephone Encounter (Signed)
Pt called to leave fax # for Dr. Chestine Spore (f) [phone], Pt had appt with Dr. Reece Agar on 10/29/13 and Dr. Reece Agar said he wanted Dr. Ophelia Charter fax # so we can fax over her labs

## 2013-11-06 NOTE — Telephone Encounter (Signed)
plz fax latest office note an blood work attn Dr. Chestine Spore and Cherly Hensen as fyi.  Thanks.

## 2013-11-10 ENCOUNTER — Ambulatory Visit (HOSPITAL_COMMUNITY)
Admission: RE | Admit: 2013-11-10 | Discharge: 2013-11-10 | Disposition: A | Discharge status: Home / Self Care | Payer: Federal, State, Local not specified - PPO | Source: Ambulatory Visit | Attending: Internal Medicine | Admitting: Internal Medicine

## 2013-11-10 DIAGNOSIS — E042 Nontoxic multinodular goiter: Secondary | ICD-10-CM

## 2013-11-10 DIAGNOSIS — E041 Nontoxic single thyroid nodule: Secondary | ICD-10-CM | POA: Insufficient documentation

## 2013-11-17 NOTE — Telephone Encounter (Signed)
Labs faxed to both locations as directed.

## 2013-11-29 ENCOUNTER — Ambulatory Visit: Payer: Federal, State, Local not specified - PPO

## 2013-11-29 ENCOUNTER — Ambulatory Visit
Admission: RE | Admit: 2013-11-29 | Discharge: 2013-11-29 | Disposition: A | Discharge status: Home / Self Care | Payer: Federal, State, Local not specified - PPO | Source: Ambulatory Visit

## 2013-11-29 DIAGNOSIS — Z1231 Encounter for screening mammogram for malignant neoplasm of breast: Secondary | ICD-10-CM

## 2014-02-23 ENCOUNTER — Ambulatory Visit (INDEPENDENT_AMBULATORY_CARE_PROVIDER_SITE_OTHER): Payer: Federal, State, Local not specified - PPO | Admitting: Family Medicine

## 2014-02-23 ENCOUNTER — Encounter: Payer: Self-pay | Admitting: Family Medicine

## 2014-02-23 VITALS — BP 120/76 | HR 78 | Temp 98.2°F | Wt 152.0 lb

## 2014-02-23 DIAGNOSIS — J029 Acute pharyngitis, unspecified: Secondary | ICD-10-CM

## 2014-02-23 LAB — POCT RAPID STREP A (OFFICE): Rapid Strep A Screen: NEGATIVE

## 2014-02-23 NOTE — Assessment & Plan Note (Signed)
Low likelihood of strep throat, however given recent exposure to strep (daughter last month) will check RST today. If negative, will recommend supportive care as per instructions.

## 2014-02-23 NOTE — Progress Notes (Signed)
Pre visit review using our clinic review tool, if applicable. No additional management support is needed unless otherwise documented below in the visit note. 

## 2014-02-23 NOTE — Patient Instructions (Signed)
You have viral pharyngitis. Push fluids and plenty of rest. May use ibuprofen 400-600mg  twice daily with food for throat inflammation. Salt water gargles. Suck on cold things like popsicles or warm things like herbal teas (whichever soothes the throat better). Return if fever >101.5, worsening pain, or trouble opening/closing mouth, or hoarse voice. Good to see you today, call clinic with questions.

## 2014-02-23 NOTE — Progress Notes (Signed)
   BP 120/76  Pulse 78  Temp(Src) 98.2 F (36.8 C) (Oral)  Wt 152 lb (68.947 kg)  SpO2 98%   CC: ST  Subjective:    Patient ID: Jodi PlanFelicia W Bramblett, female    DOB: 1971-08-18, 43 y.o.   MRN: [id number]  HPI: Jodi Young is a 43 y.o. female presenting on 02/23/2014 for Sore Throat and Generalized Body Aches   2 day history of body and joint aches, sore throat.  Some chills.  Headache.  Last week did have cough present but that has improved.  No fevers, congestion, ear or tooth pain, PNdrainage.  H/o strep in past. 43 yo sick with cough recently, other daughter with strep last month. H/o significant allergic rhinitis not regularly on meds for this.  Tends to flare in summer.  Last year treated with inhaler. No h/o asthma. No smokers at home.  Relevant past medical, surgical, family and social history reviewed and updated as indicated.  Allergies and medications reviewed and updated. Current Outpatient Prescriptions on File Prior to Visit  Medication Sig  . ferrous sulfate 325 (65 FE) MG tablet Take 325 mg by mouth 2 (two) times daily.   No current facility-administered medications on file prior to visit.    Review of Systems Per HPI unless specifically indicated above    Objective:    BP 120/76  Pulse 78  Temp(Src) 98.2 F (36.8 C) (Oral)  Wt 152 lb (68.947 kg)  SpO2 98%  Physical Exam  Nursing note and vitals reviewed. Constitutional: She appears well-developed and well-nourished. No distress.  HENT:  Head: Normocephalic and atraumatic.  Right Ear: Hearing, tympanic membrane, external ear and ear canal normal.  Left Ear: Hearing, tympanic membrane, external ear and ear canal normal.  Nose: Mucosal edema present. No rhinorrhea. Right sinus exhibits no maxillary sinus tenderness and no frontal sinus tenderness. Left sinus exhibits no maxillary sinus tenderness and no frontal sinus tenderness.  Mouth/Throat: Uvula is midline, oropharynx is clear and moist and mucous  membranes are normal. No oropharyngeal exudate, posterior oropharyngeal edema, posterior oropharyngeal erythema or tonsillar abscesses.  Eyes: Conjunctivae and EOM are normal. Pupils are equal, round, and reactive to light. No scleral icterus.  Neck: Normal range of motion. Neck supple.  Cardiovascular: Normal rate, regular rhythm, normal heart sounds and intact distal pulses.   No murmur heard. Pulmonary/Chest: Effort normal and breath sounds normal. No respiratory distress. She has no wheezes. She has no rales.  Lymphadenopathy:    She has no cervical adenopathy.  Skin: Skin is warm and dry. No rash noted.       Assessment & Plan:   Problem List Items Addressed This Visit   Acute pharyngitis - Primary     Low likelihood of strep throat, however given recent exposure to strep (daughter last month) will check RST today. If negative, will recommend supportive care as per instructions.    Relevant Orders      POCT rapid strep A       Follow up plan: No Follow-up on file.

## 2014-11-16 ENCOUNTER — Other Ambulatory Visit: Payer: Self-pay | Admitting: Internal Medicine

## 2014-11-16 DIAGNOSIS — E049 Nontoxic goiter, unspecified: Secondary | ICD-10-CM

## 2014-11-29 ENCOUNTER — Ambulatory Visit (HOSPITAL_COMMUNITY)
Admission: RE | Admit: 2014-11-29 | Discharge: 2014-11-29 | Disposition: A | Discharge status: Home / Self Care | Payer: Federal, State, Local not specified - PPO | Source: Ambulatory Visit | Attending: Internal Medicine | Admitting: Internal Medicine

## 2014-11-29 DIAGNOSIS — E041 Nontoxic single thyroid nodule: Secondary | ICD-10-CM | POA: Diagnosis not present

## 2014-11-29 DIAGNOSIS — E049 Nontoxic goiter, unspecified: Secondary | ICD-10-CM

## 2015-08-20 ENCOUNTER — Other Ambulatory Visit: Payer: Self-pay | Admitting: Family Medicine

## 2015-08-20 DIAGNOSIS — D509 Iron deficiency anemia, unspecified: Secondary | ICD-10-CM

## 2015-08-20 DIAGNOSIS — E042 Nontoxic multinodular goiter: Secondary | ICD-10-CM

## 2015-08-20 DIAGNOSIS — E0590 Thyrotoxicosis, unspecified without thyrotoxic crisis or storm: Secondary | ICD-10-CM

## 2015-08-23 ENCOUNTER — Other Ambulatory Visit (INDEPENDENT_AMBULATORY_CARE_PROVIDER_SITE_OTHER): Payer: Federal, State, Local not specified - PPO

## 2015-08-23 DIAGNOSIS — D509 Iron deficiency anemia, unspecified: Secondary | ICD-10-CM

## 2015-08-23 DIAGNOSIS — E0590 Thyrotoxicosis, unspecified without thyrotoxic crisis or storm: Secondary | ICD-10-CM | POA: Diagnosis not present

## 2015-08-23 DIAGNOSIS — E559 Vitamin D deficiency, unspecified: Secondary | ICD-10-CM

## 2015-08-23 DIAGNOSIS — E042 Nontoxic multinodular goiter: Secondary | ICD-10-CM

## 2015-08-23 LAB — IBC PANEL
IRON: 33 ug/dL — AB (ref 42–145)
Saturation Ratios: 7.9 % — ABNORMAL LOW (ref 20.0–50.0)
TRANSFERRIN: 297 mg/dL (ref 212.0–360.0)

## 2015-08-23 LAB — CBC WITH DIFFERENTIAL/PLATELET
BASOS ABS: 0 10*3/uL (ref 0.0–0.1)
Basophils Relative: 0.6 % (ref 0.0–3.0)
EOS ABS: 0.2 10*3/uL (ref 0.0–0.7)
Eosinophils Relative: 3.8 % (ref 0.0–5.0)
HEMATOCRIT: 37.4 % (ref 36.0–46.0)
HEMOGLOBIN: 12.2 g/dL (ref 12.0–15.0)
LYMPHS PCT: 42.6 % (ref 12.0–46.0)
Lymphs Abs: 2.2 10*3/uL (ref 0.7–4.0)
MCHC: 32.7 g/dL (ref 30.0–36.0)
MCV: 80.6 fl (ref 78.0–100.0)
Monocytes Absolute: 0.3 10*3/uL (ref 0.1–1.0)
Monocytes Relative: 5.9 % (ref 3.0–12.0)
Neutro Abs: 2.4 10*3/uL (ref 1.4–7.7)
Neutrophils Relative %: 47.1 % (ref 43.0–77.0)
Platelets: 228 10*3/uL (ref 150.0–400.0)
RBC: 4.64 Mil/uL (ref 3.87–5.11)
RDW: 13.6 % (ref 11.5–15.5)
WBC: 5.2 10*3/uL (ref 4.0–10.5)

## 2015-08-23 LAB — BASIC METABOLIC PANEL
BUN: 10 mg/dL (ref 6–23)
CHLORIDE: 105 meq/L (ref 96–112)
CO2: 27 mEq/L (ref 19–32)
Calcium: 9.3 mg/dL (ref 8.4–10.5)
Creatinine, Ser: 0.76 mg/dL (ref 0.40–1.20)
GFR: 106.48 mL/min (ref >60.00)
GLUCOSE: 91 mg/dL (ref 70–99)
POTASSIUM: 4.5 meq/L (ref 3.5–5.1)
SODIUM: 137 meq/L (ref 135–145)

## 2015-08-23 LAB — FERRITIN: FERRITIN: 12.2 ng/mL (ref 10.0–291.0)

## 2015-08-23 LAB — VITAMIN D 25 HYDROXY (VIT D DEFICIENCY, FRACTURES): VITD: 24.31 ng/mL — AB (ref 30.00–100.00)

## 2015-08-23 LAB — TSH: TSH: 0.5 u[IU]/mL (ref 0.35–4.50)

## 2015-08-23 LAB — T4, FREE: Free T4: 0.67 ng/dL (ref 0.60–1.60)

## 2015-08-24 LAB — T3: T3 TOTAL: 124.6 ng/dL (ref 80.0–204.0)

## 2015-08-28 ENCOUNTER — Encounter: Payer: Self-pay | Admitting: Family Medicine

## 2015-08-28 ENCOUNTER — Ambulatory Visit (INDEPENDENT_AMBULATORY_CARE_PROVIDER_SITE_OTHER): Payer: Federal, State, Local not specified - PPO | Admitting: Family Medicine

## 2015-08-28 VITALS — BP 140/96 | HR 80 | Temp 98.7°F | Ht 58.5 in | Wt 157.2 lb

## 2015-08-28 DIAGNOSIS — Z0000 Encounter for general adult medical examination without abnormal findings: Secondary | ICD-10-CM | POA: Diagnosis not present

## 2015-08-28 DIAGNOSIS — E559 Vitamin D deficiency, unspecified: Secondary | ICD-10-CM | POA: Insufficient documentation

## 2015-08-28 DIAGNOSIS — Z23 Encounter for immunization: Secondary | ICD-10-CM | POA: Diagnosis not present

## 2015-08-28 DIAGNOSIS — G43829 Menstrual migraine, not intractable, without status migrainosus: Secondary | ICD-10-CM

## 2015-08-28 DIAGNOSIS — D509 Iron deficiency anemia, unspecified: Secondary | ICD-10-CM

## 2015-08-28 DIAGNOSIS — E042 Nontoxic multinodular goiter: Secondary | ICD-10-CM

## 2015-08-28 NOTE — Assessment & Plan Note (Signed)
On Jodi Young, to start estrogen patch, per OBGYN. Uses excedrin migraine abortively.

## 2015-08-28 NOTE — Progress Notes (Signed)
BP 140/96 mmHg  Pulse 80  Temp(Src) 98.7 F (37.1 C) (Oral)  Ht 4' 10.5" (1.486 m)  Wt 157 lb 4 oz (71.328 kg)  BMI 32.30 kg/m2  LMP 08/21/2015 (Exact Date)   CC: CPE  Subjective:    Patient ID: Jodi Young Plan, female    DOB: 1970-12-07, 44 y.o.   MRN: [id number]  HPI: Jodi Young is a 44 y.o. female presenting on 08/28/2015 for Annual Exam   Hypothyroidism with goiter and nodules - sees endo Dr Margaretmary Bayley. Now off synthroid.  bp running a bit elevated recently.   IDA - working with Dr Cherly Hensen to help regular menstrual cycle. Completed novosure - due to h/o uterine anomaly. Now on Camila progestin to reduce bleeding. About to start estrogen patch for menstrual related migraines. Not as regular with iron daily.   Menstrual migraine - takes excedrin migraine or ibuprofen for this. Can last up to 3 days. Once monthly.  Vit d def - last year on 50k units weekly for 3 months. Noticed significant improvement in energy levels. Now noticing recurrent fatigue. Is not regular with daily supplement.  Preventative: Well woman - Dr. Cherly Hensen OBGYN. 08/2014 normal pap. Mammogram - normal - with OBGYN. LMP - 08/21/2015. Regular periods. Heavy flow - goes through 1 pad/hour on first day, getting shorter. H/o abnormal uterine shape. Flu - today Tdap 2013.  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 Interim medical history since our last visit reviewed. Allergies and medications reviewed and updated. Current Outpatient Prescriptions on File Prior to Visit  Medication Sig  . ferrous sulfate 325 (65 FE) MG  tablet Take 325 mg by mouth daily with breakfast.    No current facility-administered medications on file prior to visit.    Review of Systems  Constitutional: Negative for fever, chills, activity change, appetite change, fatigue and unexpected weight change.  HENT: Negative for hearing loss.   Eyes: Negative for visual disturbance.  Respiratory: Negative for cough, chest tightness, shortness of breath and wheezing.   Cardiovascular: Negative for chest pain, palpitations and leg swelling.  Gastrointestinal: Positive for nausea (migraine related). Negative for vomiting, abdominal pain, diarrhea, constipation, blood in stool and abdominal distention.  Genitourinary: Negative for hematuria and difficulty urinating.  Musculoskeletal: Negative for myalgias, arthralgias and neck pain.  Skin: Negative for rash.  Neurological: Positive for headaches (intermittent). Negative for dizziness, seizures and syncope.  Hematological: Negative for adenopathy. Does not bruise/bleed easily.  Psychiatric/Behavioral: Negative for dysphoric mood. The patient is not nervous/anxious.    Per HPI unless specifically indicated above     Objective:    BP 140/96 mmHg  Pulse 80  Temp(Src) 98.7 F (37.1 C) (Oral)  Ht 4' 10.5" (1.486 m)  Wt 157 lb 4 oz (71.328 kg)  BMI 32.30 kg/m2  LMP 08/21/2015 (Exact Date)  Wt Readings from Last 3 Encounters:  08/28/15 157 lb 4 oz (71.328 kg)  02/23/14 152 lb (68.947 kg)  10/29/13 152 lb (68.947 kg)    Physical Exam  Constitutional: She is oriented to person, place, and time. She appears well-developed and well-nourished. No distress.  HENT:  Head: Normocephalic and atraumatic.  Right Ear: Hearing, tympanic membrane, external ear and ear canal normal.  Left Ear: Hearing, tympanic membrane, external ear and ear canal normal.  Nose: Nose normal.  Mouth/Throat: Uvula is midline, oropharynx is clear and moist and mucous membranes are normal. No oropharyngeal exudate,  posterior oropharyngeal edema or posterior oropharyngeal erythema.  Eyes: Conjunctivae and EOM are normal. Pupils are equal, round, and reactive to light. No scleral icterus.  Neck: Normal range of motion. Neck supple. Thyromegaly (R sided, no nodules appreciated) present.  Cardiovascular: Normal rate, regular rhythm, normal heart sounds and intact distal pulses.   No murmur heard. Pulses:      Radial pulses are 2+ on the right side, and 2+ on the left side.  Pulmonary/Chest: Effort normal and breath sounds normal. No respiratory distress. She has no wheezes. She has no rales.  Abdominal: Soft. Bowel sounds are normal. She exhibits no distension and no mass. There is no tenderness. There is no rebound and no guarding.  Musculoskeletal: Normal range of motion. She exhibits no edema.  Lymphadenopathy:    She has no cervical adenopathy.  Neurological: She is alert and oriented to person, place, and time.  CN grossly intact, station and gait intact  Skin: Skin is warm and dry. No rash noted.  Psychiatric: She has a normal mood and affect. Her behavior is normal. Judgment and thought content normal.  Nursing note and vitals reviewed.  Results for orders placed or performed in visit on 08/23/15  Vitamin D 25 hydroxy  Result Value Ref Range   VITD 24.31 (L) 30.00 - 100.00 ng/mL  No results found for: HGBA1C per pt 5.9 (08/2014) --> 5.7%.     Assessment & Plan:   Problem List Items Addressed This Visit    Vitamin D deficiency    Encouraged compliance with 2000 IU daily.      Multiple thyroid nodules    Followed by endo Dr Chestine Spore. Off synthroid.       Menstrual migraine    On Camila, to start estrogen patch, per OBGYN. Uses excedrin migraine abortively.      Relevant Medications   ibuprofen (ADVIL,MOTRIN) 800 MG tablet   Healthcare maintenance - Primary    Preventative protocols reviewed and updated unless pt declined. Discussed healthy diet and lifestyle.       Anemia, iron  deficiency    Reviewed with patient - no longer anemic, now just residual iron deficiency. rec continue ferrous sulfate  daily.       Other Visit Diagnoses    Need for influenza vaccination        Relevant Orders    Flu Vaccine QUAD 36+ mos PF IM (Fluarix & Fluzone Quad PF) (Completed)        Follow up plan: Return in about 1 year (around 08/27/2016), or as needed, for annual exam, prior fasting for blood work.

## 2015-08-28 NOTE — Progress Notes (Signed)
Pre visit review using our clinic review tool, if applicable. No additional management support is needed unless otherwise documented below in the visit note. 

## 2015-08-28 NOTE — Assessment & Plan Note (Signed)
Followed by endo Dr Chestine Spore. Off synthroid.

## 2015-08-28 NOTE — Addendum Note (Signed)
Addended by: Eustaquio Boyden on: 08/28/2015 11:25 AM   Modules accepted: Kipp Brood

## 2015-08-28 NOTE — Assessment & Plan Note (Signed)
Encouraged compliance with 2000 IU daily. 

## 2015-08-28 NOTE — Assessment & Plan Note (Signed)
Reviewed with patient - no longer anemic, now just residual iron deficiency. rec continue ferrous sulfate  daily.

## 2015-08-28 NOTE — Assessment & Plan Note (Signed)
Preventative protocols reviewed and updated unless pt declined. Discussed healthy diet and lifestyle.  

## 2015-08-28 NOTE — Patient Instructions (Addendum)
Flu shot today. Keep an eye on blood pressures at local pharmacy.  Continue iron daily. Continue vitamin D 2000 units daily. Continue avoiding added sugars in diet.  Nice to see you today, call us with questions. Return as needed or in 1 year for next physical.

## 2015-12-27 LAB — HM MAMMOGRAPHY: HM Mammogram: NORMAL (ref 0–4)

## 2016-03-11 DIAGNOSIS — M5136 Other intervertebral disc degeneration, lumbar region: Secondary | ICD-10-CM | POA: Diagnosis not present

## 2016-03-11 DIAGNOSIS — M542 Cervicalgia: Secondary | ICD-10-CM | POA: Diagnosis not present

## 2016-03-11 DIAGNOSIS — M545 Low back pain: Secondary | ICD-10-CM | POA: Diagnosis not present

## 2016-03-11 DIAGNOSIS — M47812 Spondylosis without myelopathy or radiculopathy, cervical region: Secondary | ICD-10-CM | POA: Diagnosis not present

## 2016-03-11 DIAGNOSIS — M791 Myalgia: Secondary | ICD-10-CM | POA: Diagnosis not present

## 2016-03-11 DIAGNOSIS — M47816 Spondylosis without myelopathy or radiculopathy, lumbar region: Secondary | ICD-10-CM | POA: Diagnosis not present

## 2016-03-19 DIAGNOSIS — M545 Low back pain: Secondary | ICD-10-CM | POA: Diagnosis not present

## 2016-03-19 DIAGNOSIS — M5136 Other intervertebral disc degeneration, lumbar region: Secondary | ICD-10-CM | POA: Diagnosis not present

## 2016-03-19 DIAGNOSIS — M47816 Spondylosis without myelopathy or radiculopathy, lumbar region: Secondary | ICD-10-CM | POA: Diagnosis not present

## 2016-03-19 DIAGNOSIS — M47812 Spondylosis without myelopathy or radiculopathy, cervical region: Secondary | ICD-10-CM | POA: Diagnosis not present

## 2016-03-20 DIAGNOSIS — M5136 Other intervertebral disc degeneration, lumbar region: Secondary | ICD-10-CM | POA: Diagnosis not present

## 2016-03-20 DIAGNOSIS — M47812 Spondylosis without myelopathy or radiculopathy, cervical region: Secondary | ICD-10-CM | POA: Diagnosis not present

## 2016-03-20 DIAGNOSIS — M545 Low back pain: Secondary | ICD-10-CM | POA: Diagnosis not present

## 2016-03-20 DIAGNOSIS — M47816 Spondylosis without myelopathy or radiculopathy, lumbar region: Secondary | ICD-10-CM | POA: Diagnosis not present

## 2016-03-21 DIAGNOSIS — M47816 Spondylosis without myelopathy or radiculopathy, lumbar region: Secondary | ICD-10-CM | POA: Diagnosis not present

## 2016-03-21 DIAGNOSIS — M545 Low back pain: Secondary | ICD-10-CM | POA: Diagnosis not present

## 2016-03-21 DIAGNOSIS — M4604 Spinal enthesopathy, thoracic region: Secondary | ICD-10-CM | POA: Diagnosis not present

## 2016-03-21 DIAGNOSIS — M5136 Other intervertebral disc degeneration, lumbar region: Secondary | ICD-10-CM | POA: Diagnosis not present

## 2016-03-21 DIAGNOSIS — M5384 Other specified dorsopathies, thoracic region: Secondary | ICD-10-CM | POA: Diagnosis not present

## 2016-03-21 DIAGNOSIS — M47812 Spondylosis without myelopathy or radiculopathy, cervical region: Secondary | ICD-10-CM | POA: Diagnosis not present

## 2016-03-22 DIAGNOSIS — M5136 Other intervertebral disc degeneration, lumbar region: Secondary | ICD-10-CM | POA: Diagnosis not present

## 2016-03-22 DIAGNOSIS — M545 Low back pain: Secondary | ICD-10-CM | POA: Diagnosis not present

## 2016-03-22 DIAGNOSIS — M47812 Spondylosis without myelopathy or radiculopathy, cervical region: Secondary | ICD-10-CM | POA: Diagnosis not present

## 2016-03-22 DIAGNOSIS — M47816 Spondylosis without myelopathy or radiculopathy, lumbar region: Secondary | ICD-10-CM | POA: Diagnosis not present

## 2016-03-26 DIAGNOSIS — M5136 Other intervertebral disc degeneration, lumbar region: Secondary | ICD-10-CM | POA: Diagnosis not present

## 2016-03-26 DIAGNOSIS — M47812 Spondylosis without myelopathy or radiculopathy, cervical region: Secondary | ICD-10-CM | POA: Diagnosis not present

## 2016-03-26 DIAGNOSIS — M545 Low back pain: Secondary | ICD-10-CM | POA: Diagnosis not present

## 2016-03-26 DIAGNOSIS — M170 Bilateral primary osteoarthritis of knee: Secondary | ICD-10-CM | POA: Diagnosis not present

## 2016-03-26 DIAGNOSIS — M25562 Pain in left knee: Secondary | ICD-10-CM | POA: Diagnosis not present

## 2016-03-26 DIAGNOSIS — M5386 Other specified dorsopathies, lumbar region: Secondary | ICD-10-CM | POA: Diagnosis not present

## 2016-03-26 DIAGNOSIS — M47816 Spondylosis without myelopathy or radiculopathy, lumbar region: Secondary | ICD-10-CM | POA: Diagnosis not present

## 2016-03-26 DIAGNOSIS — M4606 Spinal enthesopathy, lumbar region: Secondary | ICD-10-CM | POA: Diagnosis not present

## 2016-03-26 DIAGNOSIS — M25561 Pain in right knee: Secondary | ICD-10-CM | POA: Diagnosis not present

## 2016-03-27 DIAGNOSIS — M5136 Other intervertebral disc degeneration, lumbar region: Secondary | ICD-10-CM | POA: Diagnosis not present

## 2016-03-27 DIAGNOSIS — M545 Low back pain: Secondary | ICD-10-CM | POA: Diagnosis not present

## 2016-03-27 DIAGNOSIS — M47816 Spondylosis without myelopathy or radiculopathy, lumbar region: Secondary | ICD-10-CM | POA: Diagnosis not present

## 2016-03-27 DIAGNOSIS — M47812 Spondylosis without myelopathy or radiculopathy, cervical region: Secondary | ICD-10-CM | POA: Diagnosis not present

## 2016-03-28 DIAGNOSIS — M47816 Spondylosis without myelopathy or radiculopathy, lumbar region: Secondary | ICD-10-CM | POA: Diagnosis not present

## 2016-03-28 DIAGNOSIS — M47812 Spondylosis without myelopathy or radiculopathy, cervical region: Secondary | ICD-10-CM | POA: Diagnosis not present

## 2016-03-28 DIAGNOSIS — M5384 Other specified dorsopathies, thoracic region: Secondary | ICD-10-CM | POA: Diagnosis not present

## 2016-03-28 DIAGNOSIS — M4604 Spinal enthesopathy, thoracic region: Secondary | ICD-10-CM | POA: Diagnosis not present

## 2016-03-28 DIAGNOSIS — M5136 Other intervertebral disc degeneration, lumbar region: Secondary | ICD-10-CM | POA: Diagnosis not present

## 2016-03-28 DIAGNOSIS — M545 Low back pain: Secondary | ICD-10-CM | POA: Diagnosis not present

## 2016-04-02 DIAGNOSIS — M5386 Other specified dorsopathies, lumbar region: Secondary | ICD-10-CM | POA: Diagnosis not present

## 2016-04-02 DIAGNOSIS — M4606 Spinal enthesopathy, lumbar region: Secondary | ICD-10-CM | POA: Diagnosis not present

## 2016-04-02 DIAGNOSIS — M545 Low back pain: Secondary | ICD-10-CM | POA: Diagnosis not present

## 2016-04-02 DIAGNOSIS — M5136 Other intervertebral disc degeneration, lumbar region: Secondary | ICD-10-CM | POA: Diagnosis not present

## 2016-04-02 DIAGNOSIS — M47812 Spondylosis without myelopathy or radiculopathy, cervical region: Secondary | ICD-10-CM | POA: Diagnosis not present

## 2016-04-02 DIAGNOSIS — M47816 Spondylosis without myelopathy or radiculopathy, lumbar region: Secondary | ICD-10-CM | POA: Diagnosis not present

## 2016-04-03 DIAGNOSIS — M5136 Other intervertebral disc degeneration, lumbar region: Secondary | ICD-10-CM | POA: Diagnosis not present

## 2016-04-03 DIAGNOSIS — M47816 Spondylosis without myelopathy or radiculopathy, lumbar region: Secondary | ICD-10-CM | POA: Diagnosis not present

## 2016-04-03 DIAGNOSIS — M47812 Spondylosis without myelopathy or radiculopathy, cervical region: Secondary | ICD-10-CM | POA: Diagnosis not present

## 2016-04-03 DIAGNOSIS — M545 Low back pain: Secondary | ICD-10-CM | POA: Diagnosis not present

## 2016-04-04 DIAGNOSIS — M4604 Spinal enthesopathy, thoracic region: Secondary | ICD-10-CM | POA: Diagnosis not present

## 2016-04-04 DIAGNOSIS — M545 Low back pain: Secondary | ICD-10-CM | POA: Diagnosis not present

## 2016-04-04 DIAGNOSIS — M47812 Spondylosis without myelopathy or radiculopathy, cervical region: Secondary | ICD-10-CM | POA: Diagnosis not present

## 2016-04-04 DIAGNOSIS — M5136 Other intervertebral disc degeneration, lumbar region: Secondary | ICD-10-CM | POA: Diagnosis not present

## 2016-04-04 DIAGNOSIS — M5384 Other specified dorsopathies, thoracic region: Secondary | ICD-10-CM | POA: Diagnosis not present

## 2016-04-04 DIAGNOSIS — M47816 Spondylosis without myelopathy or radiculopathy, lumbar region: Secondary | ICD-10-CM | POA: Diagnosis not present

## 2016-04-10 DIAGNOSIS — M47812 Spondylosis without myelopathy or radiculopathy, cervical region: Secondary | ICD-10-CM | POA: Diagnosis not present

## 2016-04-10 DIAGNOSIS — M5136 Other intervertebral disc degeneration, lumbar region: Secondary | ICD-10-CM | POA: Diagnosis not present

## 2016-04-10 DIAGNOSIS — M545 Low back pain: Secondary | ICD-10-CM | POA: Diagnosis not present

## 2016-04-10 DIAGNOSIS — M47816 Spondylosis without myelopathy or radiculopathy, lumbar region: Secondary | ICD-10-CM | POA: Diagnosis not present

## 2016-04-11 DIAGNOSIS — M47812 Spondylosis without myelopathy or radiculopathy, cervical region: Secondary | ICD-10-CM | POA: Diagnosis not present

## 2016-04-11 DIAGNOSIS — M545 Low back pain: Secondary | ICD-10-CM | POA: Diagnosis not present

## 2016-04-11 DIAGNOSIS — M5136 Other intervertebral disc degeneration, lumbar region: Secondary | ICD-10-CM | POA: Diagnosis not present

## 2016-04-11 DIAGNOSIS — M47816 Spondylosis without myelopathy or radiculopathy, lumbar region: Secondary | ICD-10-CM | POA: Diagnosis not present

## 2016-04-16 DIAGNOSIS — M47812 Spondylosis without myelopathy or radiculopathy, cervical region: Secondary | ICD-10-CM | POA: Diagnosis not present

## 2016-04-16 DIAGNOSIS — M545 Low back pain: Secondary | ICD-10-CM | POA: Diagnosis not present

## 2016-04-16 DIAGNOSIS — M4602 Spinal enthesopathy, cervical region: Secondary | ICD-10-CM | POA: Diagnosis not present

## 2016-04-16 DIAGNOSIS — M5136 Other intervertebral disc degeneration, lumbar region: Secondary | ICD-10-CM | POA: Diagnosis not present

## 2016-04-16 DIAGNOSIS — M5382 Other specified dorsopathies, cervical region: Secondary | ICD-10-CM | POA: Diagnosis not present

## 2016-04-16 DIAGNOSIS — M47816 Spondylosis without myelopathy or radiculopathy, lumbar region: Secondary | ICD-10-CM | POA: Diagnosis not present

## 2016-04-17 DIAGNOSIS — M545 Low back pain: Secondary | ICD-10-CM | POA: Diagnosis not present

## 2016-04-17 DIAGNOSIS — M47812 Spondylosis without myelopathy or radiculopathy, cervical region: Secondary | ICD-10-CM | POA: Diagnosis not present

## 2016-04-17 DIAGNOSIS — M47816 Spondylosis without myelopathy or radiculopathy, lumbar region: Secondary | ICD-10-CM | POA: Diagnosis not present

## 2016-04-17 DIAGNOSIS — M5136 Other intervertebral disc degeneration, lumbar region: Secondary | ICD-10-CM | POA: Diagnosis not present

## 2016-04-18 DIAGNOSIS — M545 Low back pain: Secondary | ICD-10-CM | POA: Diagnosis not present

## 2016-04-18 DIAGNOSIS — M4606 Spinal enthesopathy, lumbar region: Secondary | ICD-10-CM | POA: Diagnosis not present

## 2016-04-18 DIAGNOSIS — M47812 Spondylosis without myelopathy or radiculopathy, cervical region: Secondary | ICD-10-CM | POA: Diagnosis not present

## 2016-04-18 DIAGNOSIS — M5386 Other specified dorsopathies, lumbar region: Secondary | ICD-10-CM | POA: Diagnosis not present

## 2016-04-18 DIAGNOSIS — M5136 Other intervertebral disc degeneration, lumbar region: Secondary | ICD-10-CM | POA: Diagnosis not present

## 2016-04-18 DIAGNOSIS — M47816 Spondylosis without myelopathy or radiculopathy, lumbar region: Secondary | ICD-10-CM | POA: Diagnosis not present

## 2016-04-23 DIAGNOSIS — M4604 Spinal enthesopathy, thoracic region: Secondary | ICD-10-CM | POA: Diagnosis not present

## 2016-04-23 DIAGNOSIS — M5384 Other specified dorsopathies, thoracic region: Secondary | ICD-10-CM | POA: Diagnosis not present

## 2016-04-23 DIAGNOSIS — M545 Low back pain: Secondary | ICD-10-CM | POA: Diagnosis not present

## 2016-04-23 DIAGNOSIS — M47812 Spondylosis without myelopathy or radiculopathy, cervical region: Secondary | ICD-10-CM | POA: Diagnosis not present

## 2016-04-23 DIAGNOSIS — M47816 Spondylosis without myelopathy or radiculopathy, lumbar region: Secondary | ICD-10-CM | POA: Diagnosis not present

## 2016-04-23 DIAGNOSIS — M5136 Other intervertebral disc degeneration, lumbar region: Secondary | ICD-10-CM | POA: Diagnosis not present

## 2016-04-24 DIAGNOSIS — M47812 Spondylosis without myelopathy or radiculopathy, cervical region: Secondary | ICD-10-CM | POA: Diagnosis not present

## 2016-04-24 DIAGNOSIS — M545 Low back pain: Secondary | ICD-10-CM | POA: Diagnosis not present

## 2016-04-24 DIAGNOSIS — M47816 Spondylosis without myelopathy or radiculopathy, lumbar region: Secondary | ICD-10-CM | POA: Diagnosis not present

## 2016-04-24 DIAGNOSIS — M5136 Other intervertebral disc degeneration, lumbar region: Secondary | ICD-10-CM | POA: Diagnosis not present

## 2016-04-25 DIAGNOSIS — M545 Low back pain: Secondary | ICD-10-CM | POA: Diagnosis not present

## 2016-04-25 DIAGNOSIS — M47816 Spondylosis without myelopathy or radiculopathy, lumbar region: Secondary | ICD-10-CM | POA: Diagnosis not present

## 2016-04-25 DIAGNOSIS — M47812 Spondylosis without myelopathy or radiculopathy, cervical region: Secondary | ICD-10-CM | POA: Diagnosis not present

## 2016-04-25 DIAGNOSIS — M4602 Spinal enthesopathy, cervical region: Secondary | ICD-10-CM | POA: Diagnosis not present

## 2016-04-25 DIAGNOSIS — M5382 Other specified dorsopathies, cervical region: Secondary | ICD-10-CM | POA: Diagnosis not present

## 2016-04-25 DIAGNOSIS — M5136 Other intervertebral disc degeneration, lumbar region: Secondary | ICD-10-CM | POA: Diagnosis not present

## 2016-04-25 DIAGNOSIS — M5386 Other specified dorsopathies, lumbar region: Secondary | ICD-10-CM | POA: Diagnosis not present

## 2016-04-25 DIAGNOSIS — M4606 Spinal enthesopathy, lumbar region: Secondary | ICD-10-CM | POA: Diagnosis not present

## 2016-05-07 DIAGNOSIS — M5136 Other intervertebral disc degeneration, lumbar region: Secondary | ICD-10-CM | POA: Diagnosis not present

## 2016-05-07 DIAGNOSIS — M5382 Other specified dorsopathies, cervical region: Secondary | ICD-10-CM | POA: Diagnosis not present

## 2016-05-07 DIAGNOSIS — M47812 Spondylosis without myelopathy or radiculopathy, cervical region: Secondary | ICD-10-CM | POA: Diagnosis not present

## 2016-05-07 DIAGNOSIS — M4606 Spinal enthesopathy, lumbar region: Secondary | ICD-10-CM | POA: Diagnosis not present

## 2016-05-07 DIAGNOSIS — M4602 Spinal enthesopathy, cervical region: Secondary | ICD-10-CM | POA: Diagnosis not present

## 2016-05-07 DIAGNOSIS — M545 Low back pain: Secondary | ICD-10-CM | POA: Diagnosis not present

## 2016-05-07 DIAGNOSIS — M47816 Spondylosis without myelopathy or radiculopathy, lumbar region: Secondary | ICD-10-CM | POA: Diagnosis not present

## 2016-05-08 DIAGNOSIS — M5136 Other intervertebral disc degeneration, lumbar region: Secondary | ICD-10-CM | POA: Diagnosis not present

## 2016-05-08 DIAGNOSIS — M545 Low back pain: Secondary | ICD-10-CM | POA: Diagnosis not present

## 2016-05-08 DIAGNOSIS — M47816 Spondylosis without myelopathy or radiculopathy, lumbar region: Secondary | ICD-10-CM | POA: Diagnosis not present

## 2016-05-08 DIAGNOSIS — M47812 Spondylosis without myelopathy or radiculopathy, cervical region: Secondary | ICD-10-CM | POA: Diagnosis not present

## 2016-06-10 DIAGNOSIS — M545 Low back pain: Secondary | ICD-10-CM | POA: Diagnosis not present

## 2016-06-10 DIAGNOSIS — M47816 Spondylosis without myelopathy or radiculopathy, lumbar region: Secondary | ICD-10-CM | POA: Diagnosis not present

## 2016-06-10 DIAGNOSIS — M5136 Other intervertebral disc degeneration, lumbar region: Secondary | ICD-10-CM | POA: Diagnosis not present

## 2016-06-10 DIAGNOSIS — M47812 Spondylosis without myelopathy or radiculopathy, cervical region: Secondary | ICD-10-CM | POA: Diagnosis not present

## 2016-06-21 DIAGNOSIS — K080 Exfoliation of teeth due to systemic causes: Secondary | ICD-10-CM | POA: Diagnosis not present

## 2016-08-23 DIAGNOSIS — D1724 Benign lipomatous neoplasm of skin and subcutaneous tissue of left leg: Secondary | ICD-10-CM | POA: Diagnosis not present

## 2016-08-23 DIAGNOSIS — D235 Other benign neoplasm of skin of trunk: Secondary | ICD-10-CM | POA: Diagnosis not present

## 2016-10-25 LAB — HM PAP SMEAR: HM Pap smear: NORMAL

## 2016-10-31 DIAGNOSIS — Z01419 Encounter for gynecological examination (general) (routine) without abnormal findings: Secondary | ICD-10-CM | POA: Diagnosis not present

## 2016-10-31 DIAGNOSIS — Z6833 Body mass index (BMI) 33.0-33.9, adult: Secondary | ICD-10-CM | POA: Diagnosis not present

## 2016-11-07 DIAGNOSIS — E042 Nontoxic multinodular goiter: Secondary | ICD-10-CM | POA: Diagnosis not present

## 2016-11-14 DIAGNOSIS — Z1151 Encounter for screening for human papillomavirus (HPV): Secondary | ICD-10-CM | POA: Diagnosis not present

## 2016-11-14 DIAGNOSIS — Z124 Encounter for screening for malignant neoplasm of cervix: Secondary | ICD-10-CM | POA: Diagnosis not present

## 2016-12-03 ENCOUNTER — Encounter: Payer: Self-pay | Admitting: Family Medicine

## 2016-12-15 ENCOUNTER — Other Ambulatory Visit: Payer: Self-pay | Admitting: Family Medicine

## 2016-12-15 DIAGNOSIS — D509 Iron deficiency anemia, unspecified: Secondary | ICD-10-CM

## 2016-12-15 DIAGNOSIS — E559 Vitamin D deficiency, unspecified: Secondary | ICD-10-CM

## 2016-12-15 DIAGNOSIS — E042 Nontoxic multinodular goiter: Secondary | ICD-10-CM

## 2016-12-16 ENCOUNTER — Other Ambulatory Visit (INDEPENDENT_AMBULATORY_CARE_PROVIDER_SITE_OTHER): Payer: Federal, State, Local not specified - PPO

## 2016-12-16 DIAGNOSIS — E042 Nontoxic multinodular goiter: Secondary | ICD-10-CM | POA: Diagnosis not present

## 2016-12-16 DIAGNOSIS — E559 Vitamin D deficiency, unspecified: Secondary | ICD-10-CM | POA: Diagnosis not present

## 2016-12-16 DIAGNOSIS — D509 Iron deficiency anemia, unspecified: Secondary | ICD-10-CM

## 2016-12-16 DIAGNOSIS — Z23 Encounter for immunization: Secondary | ICD-10-CM

## 2016-12-16 LAB — CBC WITH DIFFERENTIAL/PLATELET
BASOS ABS: 0 10*3/uL (ref 0.0–0.1)
Basophils Relative: 0.5 % (ref 0.0–3.0)
EOS ABS: 0.1 10*3/uL (ref 0.0–0.7)
Eosinophils Relative: 1.4 % (ref 0.0–5.0)
HEMATOCRIT: 36 % (ref 36.0–46.0)
HEMOGLOBIN: 12.1 g/dL (ref 12.0–15.0)
LYMPHS PCT: 36.8 % (ref 12.0–46.0)
Lymphs Abs: 2.5 10*3/uL (ref 0.7–4.0)
MCHC: 33.5 g/dL (ref 30.0–36.0)
MCV: 80 fl (ref 78.0–100.0)
MONOS PCT: 6.7 % (ref 3.0–12.0)
Monocytes Absolute: 0.5 10*3/uL (ref 0.1–1.0)
Neutro Abs: 3.7 10*3/uL (ref 1.4–7.7)
Neutrophils Relative %: 54.6 % (ref 43.0–77.0)
PLATELETS: 224 10*3/uL (ref 150.0–400.0)
RBC: 4.49 Mil/uL (ref 3.87–5.11)
RDW: 13.2 % (ref 11.5–15.5)
WBC: 6.8 10*3/uL (ref 4.0–10.5)

## 2016-12-16 LAB — IBC PANEL
Iron: 72 ug/dL (ref 42–145)
SATURATION RATIOS: 17.4 % — AB (ref 20.0–50.0)
TRANSFERRIN: 295 mg/dL (ref 212.0–360.0)

## 2016-12-16 LAB — T4, FREE: Free T4: 0.78 ng/dL (ref 0.60–1.60)

## 2016-12-16 LAB — BASIC METABOLIC PANEL
BUN: 7 mg/dL (ref 6–23)
CALCIUM: 9.2 mg/dL (ref 8.4–10.5)
CO2: 28 meq/L (ref 19–32)
CREATININE: 0.82 mg/dL (ref 0.40–1.20)
Chloride: 102 mEq/L (ref 96–112)
GFR: 96.95 mL/min (ref >60.00)
Glucose, Bld: 87 mg/dL (ref 70–99)
Potassium: 4.2 mEq/L (ref 3.5–5.1)
Sodium: 136 mEq/L (ref 135–145)

## 2016-12-16 LAB — FERRITIN: FERRITIN: 11.4 ng/mL (ref 10.0–291.0)

## 2016-12-16 LAB — TSH: TSH: 1.17 u[IU]/mL (ref 0.35–4.50)

## 2016-12-16 LAB — VITAMIN D 25 HYDROXY (VIT D DEFICIENCY, FRACTURES): VITD: 19.47 ng/mL — AB (ref 30.00–100.00)

## 2016-12-16 NOTE — Addendum Note (Signed)
Addended by: Baldomero LamyHAVERS, Saphia Vanderford C on: 12/16/2016 10:43 AM   Modules accepted: Orders

## 2016-12-19 ENCOUNTER — Encounter: Payer: Federal, State, Local not specified - PPO | Admitting: Family Medicine

## 2016-12-20 ENCOUNTER — Encounter: Payer: Federal, State, Local not specified - PPO | Admitting: Family Medicine

## 2017-01-02 ENCOUNTER — Encounter: Payer: Self-pay | Admitting: Family Medicine

## 2017-01-02 ENCOUNTER — Ambulatory Visit (INDEPENDENT_AMBULATORY_CARE_PROVIDER_SITE_OTHER): Payer: Federal, State, Local not specified - PPO | Admitting: Family Medicine

## 2017-01-02 VITALS — BP 126/84 | HR 76 | Temp 98.8°F | Ht 58.5 in | Wt 161.5 lb

## 2017-01-02 DIAGNOSIS — E042 Nontoxic multinodular goiter: Secondary | ICD-10-CM

## 2017-01-02 DIAGNOSIS — Z0000 Encounter for general adult medical examination without abnormal findings: Secondary | ICD-10-CM

## 2017-01-02 DIAGNOSIS — E559 Vitamin D deficiency, unspecified: Secondary | ICD-10-CM | POA: Diagnosis not present

## 2017-01-02 DIAGNOSIS — D509 Iron deficiency anemia, unspecified: Secondary | ICD-10-CM | POA: Diagnosis not present

## 2017-01-02 MED ORDER — VITAMIN D3 1.25 MG (50000 UT) PO TABS: 1.0000 | ORAL_TABLET | ORAL | 1 refills | Status: DC

## 2017-01-02 NOTE — Patient Instructions (Addendum)
Restart iron tablets daily Restart vitamin D - 50,000 units weekly for 3 months then return to 2000 units daily.  You are doing well today.  Return as needed or in 1 year for next physical.   Health Maintenance, Female Introduction Adopting a healthy lifestyle and getting preventive care can go a long way to promote health and wellness. Talk with your health care provider about what schedule of regular examinations is right for you. This is a good chance for you to check in with your provider about disease prevention and staying healthy. In between checkups, there are plenty of things you can do on your own. Experts have done a lot of research about which lifestyle changes and preventive measures are most likely to keep you healthy. Ask your health care provider for more information. Weight and diet Eat a healthy diet  Be sure to include plenty of vegetables, fruits, low-fat dairy products, and lean protein.  Do not eat a lot of foods high in solid fats, added sugars, or salt.  Get regular exercise. This is one of the most important things you can do for your health.  Most adults should exercise for at least 150 minutes each week. The exercise should increase your heart rate and make you sweat (moderate-intensity exercise).  Most adults should also do strengthening exercises at least twice a week. This is in addition to the moderate-intensity exercise. Maintain a healthy weight  Body mass index (BMI) is a measurement that can be used to identify possible weight problems. It estimates body fat based on height and weight. Your health care provider can help determine your BMI and help you achieve or maintain a healthy weight.  For females 76 years of age and older:  A BMI below 18.5 is considered underweight.  A BMI of 18.5 to 24.9 is normal.  A BMI of 25 to 29.9 is considered overweight.  A BMI of 30 and above is considered obese. Watch levels of cholesterol and blood lipids  You  should start having your blood tested for lipids and cholesterol at 45 years of age, then have this test every 5 years.  You may need to have your cholesterol levels checked more often if:  Your lipid or cholesterol levels are high.  You are older than 45 years of age.  You are at high risk for heart disease. Cancer screening Lung Cancer  Lung cancer screening is recommended for adults 18-35 years old who are at high risk for lung cancer because of a history of smoking.  A yearly low-dose CT scan of the lungs is recommended for people who:  Currently smoke.  Have quit within the past 15 years.  Have at least a 30-pack-year history of smoking. A pack year is smoking an average of one pack of cigarettes a day for 1 year.  Yearly screening should continue until it has been 15 years since you quit.  Yearly screening should stop if you develop a health problem that would prevent you from having lung cancer treatment. Breast Cancer  Practice breast self-awareness. This means understanding how your breasts normally appear and feel.  It also means doing regular breast self-exams. Let your health care provider know about any changes, no matter how small.  If you are in your 20s or 30s, you should have a clinical breast exam (CBE) by a health care provider every 1-3 years as part of a regular health exam.  If you are 57 or older, have a CBE every  year. Also consider having a breast X-ray (mammogram) every year.  If you have a family history of breast cancer, talk to your health care provider about genetic screening.  If you are at high risk for breast cancer, talk to your health care provider about having an MRI and a mammogram every year.  Breast cancer gene (BRCA) assessment is recommended for women who have family members with BRCA-related cancers. BRCA-related cancers include:  Breast.  Ovarian.  Tubal.  Peritoneal cancers.  Results of the assessment will determine the need  for genetic counseling and BRCA1 and BRCA2 testing. Cervical Cancer  Your health care provider may recommend that you be screened regularly for cancer of the pelvic organs (ovaries, uterus, and vagina). This screening involves a pelvic examination, including checking for microscopic changes to the surface of your cervix (Pap test). You may be encouraged to have this screening done every 3 years, beginning at age 51.  For women ages 57-65, health care providers may recommend pelvic exams and Pap testing every 3 years, or they may recommend the Pap and pelvic exam, combined with testing for human papilloma virus (HPV), every 5 years. Some types of HPV increase your risk of cervical cancer. Testing for HPV may also be done on women of any age with unclear Pap test results.  Other health care providers may not recommend any screening for nonpregnant women who are considered low risk for pelvic cancer and who do not have symptoms. Ask your health care provider if a screening pelvic exam is right for you.  If you have had past treatment for cervical cancer or a condition that could lead to cancer, you need Pap tests and screening for cancer for at least 20 years after your treatment. If Pap tests have been discontinued, your risk factors (such as having a new sexual partner) need to be reassessed to determine if screening should resume. Some women have medical problems that increase the chance of getting cervical cancer. In these cases, your health care provider may recommend more frequent screening and Pap tests. Colorectal Cancer  This type of cancer can be detected and often prevented.  Routine colorectal cancer screening usually begins at 45 years of age and continues through 45 years of age.  Your health care provider may recommend screening at an earlier age if you have risk factors for colon cancer.  Your health care provider may also recommend using home test kits to check for hidden blood in the  stool.  A small camera at the end of a tube can be used to examine your colon directly (sigmoidoscopy or colonoscopy). This is done to check for the earliest forms of colorectal cancer.  Routine screening usually begins at age 4.  Direct examination of the colon should be repeated every 5-10 years through 45 years of age. However, you may need to be screened more often if early forms of precancerous polyps or small growths are found. Skin Cancer  Check your skin from head to toe regularly.  Tell your health care provider about any new moles or changes in moles, especially if there is a change in a mole's shape or color.  Also tell your health care provider if you have a mole that is larger than the size of a pencil eraser.  Always use sunscreen. Apply sunscreen liberally and repeatedly throughout the day.  Protect yourself by wearing long sleeves, pants, a wide-brimmed hat, and sunglasses whenever you are outside. Heart disease, diabetes, and high blood pressure  High blood pressure causes heart disease and increases the risk of stroke. High blood pressure is more likely to develop in:  People who have blood pressure in the high end of the normal range (130-139/85-89 mm Hg).  People who are overweight or obese.  People who are African American.  If you are 84-42 years of age, have your blood pressure checked every 3-5 years. If you are 71 years of age or older, have your blood pressure checked every year. You should have your blood pressure measured twice-once when you are at a hospital or clinic, and once when you are not at a hospital or clinic. Record the average of the two measurements. To check your blood pressure when you are not at a hospital or clinic, you can use:  An automated blood pressure machine at a pharmacy.  A home blood pressure monitor.  If you are between 69 years and 13 years old, ask your health care provider if you should take aspirin to prevent  strokes.  Have regular diabetes screenings. This involves taking a blood sample to check your fasting blood sugar level.  If you are at a normal weight and have a low risk for diabetes, have this test once every three years after 45 years of age.  If you are overweight and have a high risk for diabetes, consider being tested at a younger age or more often. Preventing infection Hepatitis B  If you have a higher risk for hepatitis B, you should be screened for this virus. You are considered at high risk for hepatitis B if:  You were born in a country where hepatitis B is common. Ask your health care provider which countries are considered high risk.  Your parents were born in a high-risk country, and you have not been immunized against hepatitis B (hepatitis B vaccine).  You have HIV or AIDS.  You use needles to inject street drugs.  You live with someone who has hepatitis B.  You have had sex with someone who has hepatitis B.  You get hemodialysis treatment.  You take certain medicines for conditions, including cancer, organ transplantation, and autoimmune conditions. Hepatitis C  Blood testing is recommended for:  Everyone born from [birth year] through 1964.  Anyone with known risk factors for hepatitis C. Sexually transmitted infections (STIs)  You should be screened for sexually transmitted infections (STIs) including gonorrhea and chlamydia if:  You are sexually active and are younger than 45 years of age.  You are older than 45 years of age and your health care provider tells you that you are at risk for this type of infection.  Your sexual activity has changed since you were last screened and you are at an increased risk for chlamydia or gonorrhea. Ask your health care provider if you are at risk.  If you do not have HIV, but are at risk, it may be recommended that you take a prescription medicine daily to prevent HIV infection. This is called pre-exposure prophylaxis  (PrEP). You are considered at risk if:  You are sexually active and do not regularly use condoms or know the HIV status of your partner(s).  You take drugs by injection.  You are sexually active with a partner who has HIV. Talk with your health care provider about whether you are at high risk of being infected with HIV. If you choose to begin PrEP, you should first be tested for HIV. You should then be tested every 3 months for as long  as you are taking PrEP. Pregnancy  If you are premenopausal and you may become pregnant, ask your health care provider about preconception counseling.  If you may become pregnant, take 400 to 800 micrograms (mcg) of folic acid every day.  If you want to prevent pregnancy, talk to your health care provider about birth control (contraception). Osteoporosis and menopause  Osteoporosis is a disease in which the bones lose minerals and strength with aging. This can result in serious bone fractures. Your risk for osteoporosis can be identified using a bone density scan.  If you are 13 years of age or older, or if you are at risk for osteoporosis and fractures, ask your health care provider if you should be screened.  Ask your health care provider whether you should take a calcium or vitamin D supplement to lower your risk for osteoporosis.  Menopause may have certain physical symptoms and risks.  Hormone replacement therapy may reduce some of these symptoms and risks. Talk to your health care provider about whether hormone replacement therapy is right for you. Follow these instructions at home:  Schedule regular health, dental, and eye exams.  Stay current with your immunizations.  Do not use any tobacco products including cigarettes, chewing tobacco, or electronic cigarettes.  If you are pregnant, do not drink alcohol.  If you are breastfeeding, limit how much and how often you drink alcohol.  Limit alcohol intake to no more than 1 drink per day for  nonpregnant women. One drink equals 12 ounces of beer, 5 ounces of wine, or 1 ounces of hard liquor.  Do not use street drugs.  Do not share needles.  Ask your health care provider for help if you need support or information about quitting drugs.  Tell your health care provider if you often feel depressed.  Tell your health care provider if you have ever been abused or do not feel safe at home. This information is not intended to replace advice given to you by your health care provider. Make sure you discuss any questions you have with your health care provider. Document Released: 05/27/2011 Document Revised: 04/18/2016 Document Reviewed: 08/15/2015  2017 Elsevier

## 2017-01-02 NOTE — Progress Notes (Signed)
Pre visit review using our clinic review tool, if applicable. No additional management support is needed unless otherwise documented below in the visit note. 

## 2017-01-02 NOTE — Assessment & Plan Note (Signed)
TFTs stable. Pt will return to endo.

## 2017-01-02 NOTE — Assessment & Plan Note (Signed)
Overall feeling well. Ongoing low stores - pt will restart iron tablet daily.

## 2017-01-02 NOTE — Progress Notes (Signed)
BP 126/84   Pulse 76   Temp 98.8 F (37.1 C) (Oral)   Ht 4' 10.5" (1.486 m)   Wt 161 lb 8 oz (73.3 kg)   LMP 12/28/2016   BMI 33.18 kg/m    CC: CPE Subjective:    Patient ID: Jodi Young Plan, female    DOB: [birth year], 46 y.o.   MRN: [id number]  HPI: Jodi Young is a 46 y.o. female presenting on 01/02/2017 for Annual Exam   Hypothyroidism with goiter and nodules - sees endo Dr Margaretmary Bayley with regular imaging of thyroid. Now off synthroid.   IDA - working with Dr Cherly Hensen to help regularize menstrual cycle. Completed novosure - h/o uterine anomaly. Now on Camila progestin to reduce bleeding with climara estrogen patch for menstrual related migraines. Not as regular with iron daily.   Has been told has thalassemia trait by OBGYN.   Menstrual migraine - improved with estradiol patches, treats with PRN excedrin migraine or ibuprofen. Can last up to 3 days.   Sister with ulcerative colitis. Pt asks about inflammation.  Preventative: Well woman with OBGYN Dr. Cherly Hensen. 10/2016 pap smear Mammogram - 12/2015 with OBGYN.  LMP - 12/28/2016. H/o abnormal uterine shape. Flu shot yearly Tdap 2013.  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 Interim medical history since our last visit reviewed. Allergies and medications reviewed and updated. Current Outpatient Prescriptions on File Prior to Visit  Medication Sig  . ferrous sulfate 325 (65 FE) MG tablet Take 325 mg by mouth daily with breakfast.   . norethindrone (CAMILA) 0.35 MG  tablet Take 1 tablet by mouth daily.  . Cholecalciferol (VITAMIN D) 2000 UNITS CAPS Take 1 capsule by mouth daily.   No current facility-administered medications on file prior to visit.     Review of Systems  Constitutional: Negative for activity change, appetite change, chills, fatigue, fever and unexpected weight change.  HENT: Negative for hearing loss.   Eyes: Negative for visual disturbance.  Respiratory: Negative for cough, chest tightness, shortness of breath and wheezing.   Cardiovascular: Negative for chest pain, palpitations and leg swelling.  Gastrointestinal: Negative for abdominal distention, abdominal pain, blood in stool, constipation, diarrhea, nausea and vomiting.  Genitourinary: Negative for difficulty urinating and hematuria.  Musculoskeletal: Negative for arthralgias, myalgias and neck pain.  Skin: Negative for rash.  Neurological: Negative for dizziness, seizures, syncope and headaches.  Hematological: Negative for adenopathy. Does not bruise/bleed easily.  Psychiatric/Behavioral: Negative for dysphoric mood. The patient is not nervous/anxious.    Per HPI unless specifically indicated in ROS section     Objective:    BP 126/84   Pulse 76   Temp 98.8 F (37.1 C) (Oral)   Ht 4' 10.5" (1.486 m)   Wt 161 lb 8 oz (73.3 kg)   LMP 12/28/2016   BMI 33.18 kg/m   Wt Readings from Last 3 Encounters:  01/02/17 161 lb 8 oz (73.3 kg)  08/28/15 157 lb 4 oz (71.3 kg)  02/23/14 152 lb (68.9 kg)    Physical Exam  Constitutional: She is oriented to person, place, and  time. She appears well-developed and well-nourished. No distress.  HENT:  Head: Normocephalic and atraumatic.  Right Ear: Hearing, tympanic membrane, external ear and ear canal normal.  Left Ear: Hearing, tympanic membrane, external ear and ear canal normal.  Nose: Nose normal.  Mouth/Throat: Uvula is midline, oropharynx is clear and moist and mucous membranes are normal. No oropharyngeal exudate,  posterior oropharyngeal edema or posterior oropharyngeal erythema.  Eyes: Conjunctivae and EOM are normal. Pupils are equal, round, and reactive to light. No scleral icterus.  Neck: Normal range of motion. Neck supple.  Cardiovascular: Normal rate, regular rhythm, normal heart sounds and intact distal pulses.   No murmur heard. Pulses:      Radial pulses are 2+ on the right side, and 2+ on the left side.  Pulmonary/Chest: Effort normal and breath sounds normal. No respiratory distress. She has no wheezes. She has no rales.  Abdominal: Soft. Bowel sounds are normal. She exhibits no distension and no mass. There is no tenderness. There is no rebound and no guarding.  Musculoskeletal: Normal range of motion. She exhibits no edema.  Lymphadenopathy:    She has no cervical adenopathy.  Neurological: She is alert and oriented to person, place, and time.  CN grossly intact, station and gait intact  Skin: Skin is warm and dry. No rash noted.  Psychiatric: She has a normal mood and affect. Her behavior is normal. Judgment and thought content normal.  Nursing note and vitals reviewed.  Results for orders placed or performed in visit on 01/02/17  HM MAMMOGRAPHY  Result Value Ref Range   HM Mammogram Self Reported Normal 0-4 Bi-Rad, Self Reported Normal  HM PAP SMEAR  Result Value Ref Range   HM Pap smear per pt normal       Assessment & Plan:   Problem List Items Addressed This Visit    Anemia, iron deficiency    Overall feeling well. Ongoing low stores - pt will restart iron tablet daily.       Healthcare maintenance - Primary    Preventative protocols reviewed and updated unless pt declined. Discussed healthy diet and lifestyle.       Multiple thyroid nodules    TFTs stable. Pt will return to endo.       Vitamin D deficiency    Restart 50,000 IU weekly x 3 months then return to 2000 IU daily.           Follow up plan: Return in about 1 year (around 01/02/2018) for annual exam,  prior fasting for blood work.  Eustaquio BoydenJavier Tanysha Quant, MD

## 2017-01-02 NOTE — Assessment & Plan Note (Signed)
Preventative protocols reviewed and updated unless pt declined. Discussed healthy diet and lifestyle.  

## 2017-01-02 NOTE — Assessment & Plan Note (Signed)
Restart 50,000 IU weekly x 3 months then return to 2000 IU daily.

## 2017-01-21 DIAGNOSIS — Z1231 Encounter for screening mammogram for malignant neoplasm of breast: Secondary | ICD-10-CM | POA: Diagnosis not present

## 2017-05-06 DIAGNOSIS — E042 Nontoxic multinodular goiter: Secondary | ICD-10-CM | POA: Diagnosis not present

## 2017-06-15 ENCOUNTER — Other Ambulatory Visit: Payer: Self-pay | Admitting: Family Medicine

## 2017-06-27 DIAGNOSIS — K080 Exfoliation of teeth due to systemic causes: Secondary | ICD-10-CM | POA: Diagnosis not present

## 2017-09-25 ENCOUNTER — Ambulatory Visit (INDEPENDENT_AMBULATORY_CARE_PROVIDER_SITE_OTHER): Payer: Federal, State, Local not specified - PPO

## 2017-09-25 ENCOUNTER — Ambulatory Visit: Payer: Federal, State, Local not specified - PPO

## 2017-09-25 DIAGNOSIS — Z23 Encounter for immunization: Secondary | ICD-10-CM

## 2017-11-10 DIAGNOSIS — I10 Essential (primary) hypertension: Secondary | ICD-10-CM | POA: Diagnosis not present

## 2017-11-10 DIAGNOSIS — R7302 Impaired glucose tolerance (oral): Secondary | ICD-10-CM | POA: Diagnosis not present

## 2017-11-10 DIAGNOSIS — E042 Nontoxic multinodular goiter: Secondary | ICD-10-CM | POA: Diagnosis not present

## 2017-11-10 DIAGNOSIS — E559 Vitamin D deficiency, unspecified: Secondary | ICD-10-CM | POA: Diagnosis not present

## 2017-11-10 DIAGNOSIS — E039 Hypothyroidism, unspecified: Secondary | ICD-10-CM | POA: Diagnosis not present

## 2017-11-11 LAB — BASIC METABOLIC PANEL WITH GFR
Creatinine: 0.9 (ref 0.5–1.1)
Glucose: 87
Potassium: 4.1 (ref 3.4–5.3)

## 2017-11-11 LAB — HEPATIC FUNCTION PANEL
ALK PHOS: 70 (ref 25–125)
ALT: 11 (ref 7–35)
AST: 14 (ref 13–35)
Bilirubin, Total: 0.3

## 2017-11-11 LAB — LIPID PANEL
Cholesterol: 161 (ref 0–200)
HDL: 46 (ref 35–70)
LDL Cholesterol: 91
Triglycerides: 119 (ref 40–160)

## 2017-11-11 LAB — HEMOGLOBIN A1C: HEMOGLOBIN A1C: 5.5

## 2017-11-12 DIAGNOSIS — Z1151 Encounter for screening for human papillomavirus (HPV): Secondary | ICD-10-CM | POA: Diagnosis not present

## 2017-11-12 DIAGNOSIS — Z01419 Encounter for gynecological examination (general) (routine) without abnormal findings: Secondary | ICD-10-CM | POA: Diagnosis not present

## 2017-11-12 DIAGNOSIS — Z6833 Body mass index (BMI) 33.0-33.9, adult: Secondary | ICD-10-CM | POA: Diagnosis not present

## 2017-11-25 DIAGNOSIS — R079 Chest pain, unspecified: Secondary | ICD-10-CM | POA: Diagnosis not present

## 2017-11-27 ENCOUNTER — Telehealth: Payer: Self-pay | Admitting: Family Medicine

## 2017-11-27 ENCOUNTER — Telehealth: Payer: Self-pay

## 2017-11-27 NOTE — Telephone Encounter (Signed)
Form filled out and in Lisa's box.  

## 2017-11-27 NOTE — Telephone Encounter (Signed)
See other encounter.

## 2017-11-27 NOTE — Telephone Encounter (Signed)
Best number [phone] Pt dropped off form for blood pressure monitor from [employer] to be sign  On cart to be put in dr g in box

## 2017-11-27 NOTE — Telephone Encounter (Signed)
Copied from CRM [id number]. Topic: Appointment Scheduling - Scheduling Inquiry for Clinic >> Nov 24, 2017  1:23 PM Diana EvesHoyt, Maryann B wrote: CRM for notification. See Telephone encounter for:  Pt has a service benefits plan BP form that she needs her arm measured and signed by doc. Does she need an appt or can she just come in and have it filled out.  11/24/17.

## 2017-11-28 NOTE — Telephone Encounter (Signed)
Spoke with pt notifying her the form is ready to pick up. [Placed form at front office.] 

## 2017-12-08 ENCOUNTER — Ambulatory Visit: Payer: Federal, State, Local not specified - PPO | Admitting: Family Medicine

## 2017-12-11 ENCOUNTER — Ambulatory Visit: Payer: Federal, State, Local not specified - PPO | Admitting: Family Medicine

## 2017-12-11 ENCOUNTER — Encounter: Payer: Self-pay | Admitting: Family Medicine

## 2017-12-11 VITALS — BP 150/100 | HR 85 | Temp 98.4°F | Wt 160.5 lb

## 2017-12-11 DIAGNOSIS — R42 Dizziness and giddiness: Secondary | ICD-10-CM

## 2017-12-11 DIAGNOSIS — H81399 Other peripheral vertigo, unspecified ear: Secondary | ICD-10-CM

## 2017-12-11 DIAGNOSIS — I10 Essential (primary) hypertension: Secondary | ICD-10-CM | POA: Diagnosis not present

## 2017-12-11 DIAGNOSIS — E559 Vitamin D deficiency, unspecified: Secondary | ICD-10-CM

## 2017-12-11 MED ORDER — TRIAMTERENE-HCTZ 37.5-25 MG PO TABS: 1.0000 | ORAL_TABLET | Freq: Every day | ORAL | 3 refills | Status: DC

## 2017-12-11 NOTE — Assessment & Plan Note (Addendum)
Relatively new onset. Not well controlled on maxzide - she has only been taking regularly for the last 2-3 weeks and is hesitant to add on another medication or for switch at this time.  For now , will continue current regimen, with close monitoring of blood pressures at home, will work on low salt diet and increased aerobic exercise routine - DASH diet handout provided today.  Hormonal contraceptive may be contributing to risk. Advised discuss this with GYN.  Reassess at CPE next month.

## 2017-12-11 NOTE — Patient Instructions (Addendum)
Likely BPV, but we will check CT after recent vertigo episode - see Shirlee LimerickMarion to schedule CT.  Continue monitoring blood pressures and let me know if persistently high.  Let me know if any recurrent symptoms. Your goal blood pressure is <140/90. Work on low salt/sodium diet - goal <1.5gm (1,500mg ) per day. Eat a diet high in fruits/vegetables and whole grains.  Look into mediterranean and DASH diet. Goal activity is 16650min/wk of moderate intensity exercise.  This can be split into 30 minute chunks.  If you are not at this level, you can start with smaller 10-15 min increments and slowly build up activity. Look at www.heart.org for more resources   DASH Eating Plan DASH stands for "Dietary Approaches to Stop Hypertension." The DASH eating plan is a healthy eating plan that has been shown to reduce high blood pressure (hypertension). It may also reduce your risk for type 2 diabetes, heart disease, and stroke. The DASH eating plan may also help with weight loss. What are tips for following this plan? General guidelines  Avoid eating more than 2,300 mg (milligrams) of salt (sodium) a day. If you have hypertension, you may need to reduce your sodium intake to 1,500 mg a day.  Limit alcohol intake to no more than 1 drink a day for nonpregnant women and 2 drinks a day for men. One drink equals 12 oz of beer, 5 oz of wine, or 1 oz of hard liquor.  Work with your health care provider to maintain a healthy body weight or to lose weight. Ask what an ideal weight is for you.  Get at least 30 minutes of exercise that causes your heart to beat faster (aerobic exercise) most days of the week. Activities may include walking, swimming, or biking.  Work with your health care provider or diet and nutrition specialist (dietitian) to adjust your eating plan to your individual calorie needs. Reading food labels  Check food labels for the amount of sodium per serving. Choose foods with less than 5 percent of the  Daily Value of sodium. Generally, foods with less than 300 mg of sodium per serving fit into this eating plan.  To find whole grains, look for the word "whole" as the first word in the ingredient list. Shopping  Buy products labeled as "low-sodium" or "no salt added."  Buy fresh foods. Avoid canned foods and premade or frozen meals. Cooking  Avoid adding salt when cooking. Use salt-free seasonings or herbs instead of table salt or sea salt. Check with your health care provider or pharmacist before using salt substitutes.  Do not fry foods. Cook foods using healthy methods such as baking, boiling, grilling, and broiling instead.  Cook with heart-healthy oils, such as olive, canola, soybean, or sunflower oil. Meal planning   Eat a balanced diet that includes: ? 5 or more servings of fruits and vegetables each day. At each meal, try to fill half of your plate with fruits and vegetables. ? Up to 6-8 servings of whole grains each day. ? Less than 6 oz of lean meat, poultry, or fish each day. A 3-oz serving of meat is about the same size as a deck of cards. One egg equals 1 oz. ? 2 servings of low-fat dairy each day. ? A serving of nuts, seeds, or beans 5 times each week. ? Heart-healthy fats. Healthy fats called Omega-3 fatty acids are found in foods such as flaxseeds and coldwater fish, like sardines, salmon, and mackerel.  Limit how much you eat  of the following: ? Canned or prepackaged foods. ? Food that is high in trans fat, such as fried foods. ? Food that is high in saturated fat, such as fatty meat. ? Sweets, desserts, sugary drinks, and other foods with added sugar. ? Full-fat dairy products.  Do not salt foods before eating.  Try to eat at least 2 vegetarian meals each week.  Eat more home-cooked food and less restaurant, buffet, and fast food.  When eating at a restaurant, ask that your food be prepared with less salt or no salt, if possible. What foods are  recommended? The items listed may not be a complete list. Talk with your dietitian about what dietary choices are best for you. Grains Whole-grain or whole-wheat bread. Whole-grain or whole-wheat pasta. Brown rice. Orpah Cobb. Bulgur. Whole-grain and low-sodium cereals. Pita bread. Low-fat, low-sodium crackers. Whole-wheat flour tortillas. Vegetables Fresh or frozen vegetables (raw, steamed, roasted, or grilled). Low-sodium or reduced-sodium tomato and vegetable juice. Low-sodium or reduced-sodium tomato sauce and tomato paste. Low-sodium or reduced-sodium canned vegetables. Fruits All fresh, dried, or frozen fruit. Canned fruit in natural juice (without added sugar). Meat and other protein foods Skinless chicken or [place]. Ground chicken or [place]. Pork with fat trimmed off. Fish and seafood. Egg whites. Dried beans, peas, or lentils. Unsalted nuts, nut butters, and seeds. Unsalted canned beans. Lean cuts of beef with fat trimmed off. Low-sodium, lean deli meat. Dairy Low-fat (1%) or fat-free (skim) milk. Fat-free, low-fat, or reduced-fat cheeses. Nonfat, low-sodium ricotta or cottage cheese. Low-fat or nonfat yogurt. Low-fat, low-sodium cheese. Fats and oils Soft margarine without trans fats. Vegetable oil. Low-fat, reduced-fat, or light mayonnaise and salad dressings (reduced-sodium). Canola, safflower, olive, soybean, and sunflower oils. Avocado. Seasoning and other foods Herbs. Spices. Seasoning mixes without salt. Unsalted popcorn and pretzels. Fat-free sweets. What foods are not recommended? The items listed may not be a complete list. Talk with your dietitian about what dietary choices are best for you. Grains Baked goods made with fat, such as croissants, muffins, or some breads. Dry pasta or rice meal packs. Vegetables Creamed or fried vegetables. Vegetables in a cheese sauce. Regular canned vegetables (not low-sodium or reduced-sodium). Regular canned tomato sauce and paste (not  low-sodium or reduced-sodium). Regular tomato and vegetable juice (not low-sodium or reduced-sodium). Rosita Fire. Olives. Fruits Canned fruit in a light or heavy syrup. Fried fruit. Fruit in cream or butter sauce. Meat and other protein foods Fatty cuts of meat. Ribs. Fried meat. Tomasa Blase. Sausage. Bologna and other processed lunch meats. Salami. Fatback. Hotdogs. Bratwurst. Salted nuts and seeds. Canned beans with added salt. Canned or smoked fish. Whole eggs or egg yolks. Chicken or [place] with skin. Dairy Whole or 2% milk, cream, and half-and-half. Whole or full-fat cream cheese. Whole-fat or sweetened yogurt. Full-fat cheese. Nondairy creamers. Whipped toppings. Processed cheese and cheese spreads. Fats and oils Butter. Stick margarine. Lard. Shortening. Ghee. Bacon fat. Tropical oils, such as coconut, palm kernel, or palm oil. Seasoning and other foods Salted popcorn and pretzels. Onion salt, garlic salt, seasoned salt, table salt, and sea salt. Worcestershire sauce. Tartar sauce. Barbecue sauce. Teriyaki sauce. Soy sauce, including reduced-sodium. Steak sauce. Canned and packaged gravies. Fish sauce. Oyster sauce. Cocktail sauce. Horseradish that you find on the shelf. Ketchup. Mustard. Meat flavorings and tenderizers. Bouillon cubes. Hot sauce and Tabasco sauce. Premade or packaged marinades. Premade or packaged taco seasonings. Relishes. Regular salad dressings. Where to find more information:  National Heart, Lung, and Blood Institute: PopSteam.is  American Heart  Association: www.heart.org Summary  The DASH eating plan is a healthy eating plan that has been shown to reduce high blood pressure (hypertension). It may also reduce your risk for type 2 diabetes, heart disease, and stroke.  With the DASH eating plan, you should limit salt (sodium) intake to 2,300 mg a day. If you have hypertension, you may need to reduce your sodium intake to 1,500 mg a day.  When on the DASH eating plan,  aim to eat more fresh fruits and vegetables, whole grains, lean proteins, low-fat dairy, and heart-healthy fats.  Work with your health care provider or diet and nutrition specialist (dietitian) to adjust your eating plan to your individual calorie needs. This information is not intended to replace advice given to you by your health care provider. Make sure you discuss any questions you have with your health care provider. Document Released: 10/31/2011 Document Revised: 11/04/2016 Document Reviewed: 11/04/2016 Elsevier Interactive Patient Education  [employer].

## 2017-12-11 NOTE — Assessment & Plan Note (Signed)
Isolated episode earlier this month of prolonged vertigo most consistent with BPPV although somewhat prolonged period of time for this (several hours) - discussed pathophysiology of peripheral vertigo.  However in setting of recent uncontrolled blood pressures and hormonal contraceptives she is at higher risk for central cardiovascular cause of vertigo (ie cerebellar CVA) - will check CT head to help r/o these disorders.

## 2017-12-11 NOTE — Progress Notes (Signed)
BP (!) 150/100 (BP Location: Right Arm, Cuff Size: Normal)   Pulse 85   Temp 98.4 F (36.9 C) (Oral)   Wt 160 lb 8 oz (72.8 kg)   LMP 12/01/2017   SpO2 97%   BMI 32.97 kg/m    CC: med refill visit Subjective:    Patient ID: Jodi Young Plan, female    DOB: 12-09-1970, 47 y.o.   MRN: [id number]  HPI: Jodi Young is a 47 y.o. female presenting on 12/11/2017 for Medication Refill (Wants to discuss vit D3.) and Elevated BP (11/24/17 called EMS due to dizzines and nausea. Had feeling of room spinning about 2 hrs. Was not transported to hospital. Provided recent home BP readings and copy of EKG done by EMS. )   Recent episode of dizziness, nausea, room spinning after laying in bed, lasted 2 hours s/p EMS eval (brings 12 lead strip which was reviewed). BP at that time was 190 systolic. She did have some R sided thoracic back pain, no dyspnea. This lasted 2-3 hours.   No hearing loss with this.  No tinnitus.  No preceding URI sxs.   Elevated BP readings recently - brings log over last 2 wks showing 120-130s/90-100, HRN 79-113.  Maxzide started last month (started by Dr Chestine Spore endo who has since retired).  No HA, vision changes, CP/tightness, SOB, leg swelling.   Vit D - not taking any Takes estradiol 7d/month as well as daily norethindrone.  Making healthy diet and lifestyle changes. Has started walking regularly as well as resistance training.   Relevant past medical, surgical, family and social history reviewed and updated as indicated. Interim medical history since our last visit reviewed. Allergies and medications reviewed and updated. Outpatient Medications Prior to Visit  Medication Sig Dispense Refill  . Cholecalciferol (VITAMIN D) 2000 UNITS CAPS Take 1 capsule by mouth daily.    Marland Kitchen estradiol (CLIMARA - DOSED IN MG/24 HR) 0.1 mg/24hr patch Place 0.1 mg onto the skin every 30 (thirty) days. Apply on day 22 of cycle for menstrual migraines    . norethindrone (CAMILA) 0.35  MG tablet Take 1 tablet by mouth daily.    . Cholecalciferol (VITAMIN D3) 50000 units TABS Take 1 tablet by mouth once a week. 12 tablet 1  . ferrous sulfate 325 (65 FE) MG tablet Take 325 mg by mouth daily with breakfast.     . triamterene-hydrochlorothiazide (MAXZIDE-25) 37.5-25 MG tablet Take 1 tablet by mouth daily.  0   No facility-administered medications prior to visit.      Per HPI unless specifically indicated in ROS section below Review of Systems     Objective:    BP (!) 150/100 (BP Location: Right Arm, Cuff Size: Normal)   Pulse 85   Temp 98.4 F (36.9 C) (Oral)   Wt 160 lb 8 oz (72.8 kg)   LMP 12/01/2017   SpO2 97%   BMI 32.97 kg/m   Wt Readings from Last 3 Encounters:  12/11/17 160 lb 8 oz (72.8 kg)  01/02/17 161 lb 8 oz (73.3 kg)  08/28/15 157 lb 4 oz (71.3 kg)    Physical Exam  Constitutional: She is oriented to person, place, and time. She appears well-developed and well-nourished. No distress.  HENT:  Head: Normocephalic and atraumatic.  Mouth/Throat: Oropharynx is clear and moist. No oropharyngeal exudate.  Cardiovascular: Normal rate, regular rhythm, normal heart sounds and intact distal pulses.  No murmur heard. Pulmonary/Chest: Effort normal and breath sounds normal. No respiratory distress. She  has no wheezes. She has no rales.  Musculoskeletal: She exhibits no edema.  Neurological: She is alert and oriented to person, place, and time. She has normal strength. No cranial nerve deficit or sensory deficit.  CN 2-12 intact FTN intact Dix hallpike negative bilaterally  Skin: Skin is warm and dry. No rash noted.  Psychiatric: She has a normal mood and affect.  Nursing note and vitals reviewed.  Results for orders placed or performed in visit on 01/02/17  HM MAMMOGRAPHY  Result Value Ref Range   HM Mammogram Self Reported Normal 0-4 Bi-Rad, Self Reported Normal  HM PAP SMEAR  Result Value Ref Range   HM Pap smear per pt normal       Assessment &  Plan:   Problem List Items Addressed This Visit    Hypertension    Relatively new onset. Not well controlled on maxzide - she has only been taking regularly for the last 2-3 weeks and is hesitant to add on another medication or for switch at this time.  For now , will continue current regimen, with close monitoring of blood pressures at home, will work on low salt diet and increased aerobic exercise routine - DASH diet handout provided today.  Hormonal contraceptive may be contributing to risk. Advised discuss this with GYN.  Reassess at CPE next month.       Relevant Medications   triamterene-hydrochlorothiazide (MAXZIDE-25) 37.5-25 MG tablet   Other Relevant Orders   CT Head Wo Contrast   Vertigo - Primary    Isolated episode earlier this month of prolonged vertigo most consistent with BPPV although somewhat prolonged period of time for this (several hours) - discussed pathophysiology of peripheral vertigo.  However in setting of recent uncontrolled blood pressures and hormonal contraceptives she is at higher risk for central cardiovascular cause of vertigo (ie cerebellar CVA) - will check CT head to help r/o these disorders.       Relevant Orders   CT Head Wo Contrast   Vitamin D deficiency    Has not been supplementing. Will restart 2000 IU daily and check levels at upcoming CPE.        Other Visit Diagnoses    Other peripheral vertigo, unspecified ear       Relevant Orders   CT Head Wo Contrast       Follow up plan: No Follow-up on file.  Eustaquio BoydenJavier Blayre Papania, MD

## 2017-12-11 NOTE — Assessment & Plan Note (Addendum)
Has not been supplementing. Will restart 2000 IU daily and check levels at upcoming CPE.

## 2017-12-19 ENCOUNTER — Ambulatory Visit (INDEPENDENT_AMBULATORY_CARE_PROVIDER_SITE_OTHER)
Admission: RE | Admit: 2017-12-19 | Discharge: 2017-12-19 | Disposition: A | Discharge status: Home / Self Care | Payer: Federal, State, Local not specified - PPO | Source: Ambulatory Visit | Attending: Family Medicine | Admitting: Family Medicine

## 2017-12-19 DIAGNOSIS — H81399 Other peripheral vertigo, unspecified ear: Secondary | ICD-10-CM

## 2017-12-19 DIAGNOSIS — R42 Dizziness and giddiness: Secondary | ICD-10-CM | POA: Diagnosis not present

## 2017-12-19 DIAGNOSIS — I10 Essential (primary) hypertension: Secondary | ICD-10-CM

## 2018-01-02 ENCOUNTER — Other Ambulatory Visit: Payer: Federal, State, Local not specified - PPO

## 2018-01-02 DIAGNOSIS — K080 Exfoliation of teeth due to systemic causes: Secondary | ICD-10-CM | POA: Diagnosis not present

## 2018-01-05 ENCOUNTER — Encounter: Payer: Federal, State, Local not specified - PPO | Admitting: Family Medicine

## 2018-01-06 ENCOUNTER — Encounter: Payer: Self-pay | Admitting: Family Medicine

## 2018-01-06 ENCOUNTER — Ambulatory Visit (INDEPENDENT_AMBULATORY_CARE_PROVIDER_SITE_OTHER): Payer: Federal, State, Local not specified - PPO | Admitting: Family Medicine

## 2018-01-06 VITALS — BP 124/80 | HR 108 | Temp 98.7°F | Ht 59.0 in | Wt 160.0 lb

## 2018-01-06 DIAGNOSIS — E559 Vitamin D deficiency, unspecified: Secondary | ICD-10-CM

## 2018-01-06 DIAGNOSIS — I10 Essential (primary) hypertension: Secondary | ICD-10-CM

## 2018-01-06 DIAGNOSIS — Z0000 Encounter for general adult medical examination without abnormal findings: Secondary | ICD-10-CM

## 2018-01-06 DIAGNOSIS — E669 Obesity, unspecified: Secondary | ICD-10-CM | POA: Insufficient documentation

## 2018-01-06 DIAGNOSIS — E66811 Obesity, class 1: Secondary | ICD-10-CM

## 2018-01-06 DIAGNOSIS — D509 Iron deficiency anemia, unspecified: Secondary | ICD-10-CM

## 2018-01-06 DIAGNOSIS — E042 Nontoxic multinodular goiter: Secondary | ICD-10-CM | POA: Diagnosis not present

## 2018-01-06 DIAGNOSIS — R42 Dizziness and giddiness: Secondary | ICD-10-CM

## 2018-01-06 MED ORDER — VITAMIN D3 1.25 MG (50000 UT) PO TABS: 1.0000 | ORAL_TABLET | ORAL | 0 refills | Status: DC

## 2018-01-06 MED ORDER — TRIAMTERENE-HCTZ 37.5-25 MG PO TABS: 1.0000 | ORAL_TABLET | Freq: Every day | ORAL | 11 refills | Status: DC

## 2018-01-06 NOTE — Progress Notes (Signed)
BP 124/80 (BP Location: Left Arm, Patient Position: Sitting, Cuff Size: Normal)   Pulse (!) 108   Temp 98.7 F (37.1 C) (Oral)   Ht 4\' 11"  (1.499 m)   Wt 160 lb (72.6 kg)   LMP 11/30/2017   SpO2 98%   BMI 32.32 kg/m    CC: CPE Subjective:    Patient ID: Jodi Young, female    DOB: Jun 06, 1971, 47 y.o.   MRN: [id number]  HPI: Jodi Young is a 47 y.o. female presenting on 01/06/2018 for Annual Exam (Wants to discuss vit D. Provided copy of recent labs (front and back), 11/10/17. )   Recent reassuring CT for vertigo associated with hypertension. No further vertigo episodes. Maxzide was started by endo (who has since retired). Not taking vitamin D. Walking regularly for exercise.   Hypothyroidism - planning to establish with Dr Tedd SiasSolum   Preventative: Well woman with OBGYN Dr. Cherly Hensenousins. 10/2017 pap smear Mammogram - 12/2015 with OBGYN - pending repeat.  LMP - 11/30/2017. Some irregular cycles. Possibly perimenopausal. H/o abnormal uterine shape. Flu shot yearly Tdap 2013.  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 Interim medical history since our last visit reviewed. Allergies and medications reviewed and updated. Outpatient Medications Prior to Visit  Medication Sig Dispense Refill  . estradiol (CLIMARA - DOSED IN MG/24 HR) 0.1 mg/24hr patch Place 0.1 mg onto the skin every 30 (thirty) days. Apply on day 22 of cycle for menstrual migraines    . norethindrone (CAMILA) 0.35 MG tablet Take 1 tablet by mouth daily.    Marland Kitchen.  triamterene-hydrochlorothiazide (MAXZIDE-25) 37.5-25 MG tablet Take 1 tablet by mouth daily. 30 tablet 3  . Cholecalciferol (VITAMIN D) 2000 UNITS CAPS Take 1 capsule by mouth daily.     No facility-administered medications prior to visit.      Per HPI unless specifically indicated in ROS section below Review of Systems  Constitutional: Negative for activity change, appetite change, chills, fatigue, fever and unexpected weight change.  HENT: Negative for hearing loss.   Eyes: Negative for visual disturbance.  Respiratory: Negative for cough, chest tightness, shortness of breath and wheezing.   Cardiovascular: Negative for chest pain, palpitations and leg swelling.  Gastrointestinal: Negative for abdominal distention, abdominal pain, blood in stool, constipation, diarrhea, nausea and vomiting.  Genitourinary: Negative for difficulty urinating and hematuria.  Musculoskeletal: Negative for arthralgias, myalgias and neck pain.  Skin: Negative for rash.  Neurological: Negative for dizziness, seizures, syncope and headaches.  Hematological: Negative for adenopathy. Does not bruise/bleed easily.  Psychiatric/Behavioral: Negative for dysphoric mood. The patient is not nervous/anxious.        Objective:    BP 124/80 (BP Location: Left Arm, Patient Position: Sitting, Cuff Size: Normal)   Pulse (!) 108   Temp 98.7 F (37.1 C) (Oral)   Ht 4\' 11"  (1.499 m)   Wt 160 lb (72.6 kg)   LMP 11/30/2017   SpO2 98%   BMI 32.32 kg/m   Wt Readings from Last 3 Encounters:  01/06/18 160 lb (72.6 kg)  12/11/17 160 lb 8 oz (72.8 kg)  01/02/17 161 lb 8 oz (73.3 kg)    Physical Exam  Constitutional: She is oriented to person, place, and time. She appears well-developed and well-nourished. No distress.  HENT:  Head: Normocephalic and atraumatic.  Right Ear: Hearing, tympanic membrane, external ear and ear canal normal.  Left Ear: Hearing, tympanic membrane, external ear and ear canal normal.  Nose:  Nose normal.  Mouth/Throat: Uvula is midline, oropharynx is clear and moist and mucous membranes are normal. No oropharyngeal exudate, posterior oropharyngeal edema or posterior oropharyngeal erythema.  Eyes: Conjunctivae and EOM are normal. Pupils are equal, round, and reactive to light. No scleral icterus.  Neck: Normal range of motion. Neck supple. Thyromegaly (R>L) present.  Cardiovascular: Normal rate, regular rhythm, normal heart sounds and intact distal pulses.  No murmur heard. Pulses:      Radial pulses are 2+ on the right side, and 2+ on the left side.  Pulmonary/Chest: Effort normal and breath sounds normal. No respiratory distress. She has no wheezes. She has no rales.  Abdominal: Soft. Bowel sounds are normal. She exhibits no distension and no mass. There is no tenderness. There is no rebound and no guarding.  Musculoskeletal: Normal range of motion. She exhibits no edema.  Lymphadenopathy:    She has no cervical adenopathy.  Neurological: She is alert and oriented to person, place, and time.  CN grossly intact, station and gait intact  Skin: Skin is warm and dry. No rash noted.  Psychiatric: She has a normal mood and affect. Her behavior is normal. Judgment and thought content normal.  Nursing note and vitals reviewed.  Results for orders placed or performed in visit on 01/02/17  HM MAMMOGRAPHY  Result Value Ref Range   HM Mammogram Self Reported Normal 0-4 Bi-Rad, Self Reported Normal  HM PAP SMEAR  Result Value Ref Range   HM Pap smear per pt normal   labs she brings were reviewed and scanned.     Assessment & Plan:   Problem List Items Addressed This Visit    Anemia, iron deficiency    HCT stable.       Healthcare maintenance - Primary    Preventative protocols reviewed and updated unless pt declined. Discussed healthy diet and lifestyle.       Hypertension    Chronic, improved. Continue maxzide. Healthy diet changes reviewed.       Relevant Medications    triamterene-hydrochlorothiazide (MAXZIDE-25) 37.5-25 MG tablet   Multiple thyroid nodules    To establish with new endo Dr Tedd Sias. Labs from Dr Chestine Spore reviewed with patient and sent to scan.      Obesity, Class I, BMI 30-34.9    Reviewed healthy diet and lifestyle changes to affect sustainable weight loss.      Vertigo    Previous episode likely BPPV. No further episodes.       Vitamin D deficiency    Has not been supplementing.  Desires to start with weekly dose x 3 mo then will transition to 1000-2000 IU daily.           Follow up plan: Return in about 1 year (around 01/06/2019) for annual exam, prior fasting for blood work.  Eustaquio Boyden, MD

## 2018-01-06 NOTE — Assessment & Plan Note (Signed)
Reviewed healthy diet and lifestyle changes to affect sustainable weight loss.  

## 2018-01-06 NOTE — Assessment & Plan Note (Signed)
To establish with new endo Dr Tedd SiasSolum. Labs from Dr Chestine Sporelark reviewed with patient and sent to scan.

## 2018-01-06 NOTE — Assessment & Plan Note (Signed)
Previous episode likely BPPV. No further episodes.

## 2018-01-06 NOTE — Assessment & Plan Note (Signed)
Chronic, improved. Continue maxzide. Healthy diet changes reviewed.

## 2018-01-06 NOTE — Assessment & Plan Note (Signed)
Preventative protocols reviewed and updated unless pt declined. Discussed healthy diet and lifestyle.  

## 2018-01-06 NOTE — Assessment & Plan Note (Signed)
HCT stable

## 2018-01-06 NOTE — Assessment & Plan Note (Signed)
Has not been supplementing.  Desires to start with weekly dose x 3 mo then will transition to 1000-2000 IU daily.

## 2018-01-06 NOTE — Patient Instructions (Addendum)
Dr Joycie Peek office [phone] Take vit D weekly for 3 months then start 1000-2000 units daily over the counter.  maxzide was refilled for 1 year.  Good to see you today, call us with questions. Return as needed or in 1 year for next physical.  Health Maintenance, Female Adopting a healthy lifestyle and getting preventive care can go a long way to promote health and wellness. Talk with your health care provider about what schedule of regular examinations is right for you. This is a good chance for you to check in with your provider about disease prevention and staying healthy. In between checkups, there are plenty of things you can do on your own. Experts have done a lot of research about which lifestyle changes and preventive measures are most likely to keep you healthy. Ask your health care provider for more information. Weight and diet Eat a healthy diet  Be sure to include plenty of vegetables, fruits, low-fat dairy products, and lean protein.  Do not eat a lot of foods high in solid fats, added sugars, or salt.  Get regular exercise. This is one of the most important things you can do for your health. ? Most adults should exercise for at least 150 minutes each week. The exercise should increase your heart rate and make you sweat (moderate-intensity exercise). ? Most adults should also do strengthening exercises at least twice a week. This is in addition to the moderate-intensity exercise.  Maintain a healthy weight  Body mass index (BMI) is a measurement that can be used to identify possible weight problems. It estimates body fat based on height and weight. Your health care provider can help determine your BMI and help you achieve or maintain a healthy weight.  For females 55 years of age and older: ? A BMI below 18.5 is considered underweight. ? A BMI of 18.5 to 24.9 is normal. ? A BMI of 25 to 29.9 is considered overweight. ? A BMI of 30 and above is considered obese.  Watch  levels of cholesterol and blood lipids  You should start having your blood tested for lipids and cholesterol at 46 years of age, then have this test every 5 years.  You may need to have your cholesterol levels checked more often if: ? Your lipid or cholesterol levels are high. ? You are older than 46 years of age. ? You are at high risk for heart disease.  Cancer screening Lung Cancer  Lung cancer screening is recommended for adults 97-34 years old who are at high risk for lung cancer because of a history of smoking.  A yearly low-dose CT scan of the lungs is recommended for people who: ? Currently smoke. ? Have quit within the past 15 years. ? Have at least a 30-pack-year history of smoking. A pack year is smoking an average of one pack of cigarettes a day for 1 year.  Yearly screening should continue until it has been 15 years since you quit.  Yearly screening should stop if you develop a health problem that would prevent you from having lung cancer treatment.  Breast Cancer  Practice breast self-awareness. This means understanding how your breasts normally appear and feel.  It also means doing regular breast self-exams. Let your health care provider know about any changes, no matter how small.  If you are in your 20s or 30s, you should have a clinical breast exam (CBE) by a health care provider every 1-3 years as part of a regular health  exam.  If you are 40 or older, have a CBE every year. Also consider having a breast X-ray (mammogram) every year.  If you have a family history of breast cancer, talk to your health care provider about genetic screening.  If you are at high risk for breast cancer, talk to your health care provider about having an MRI and a mammogram every year.  Breast cancer gene (BRCA) assessment is recommended for women who have family members with BRCA-related cancers. BRCA-related cancers include: ? Breast. ? Ovarian. ? Tubal. ? Peritoneal  cancers.  Results of the assessment will determine the need for genetic counseling and BRCA1 and BRCA2 testing.  Cervical Cancer Your health care provider may recommend that you be screened regularly for cancer of the pelvic organs (ovaries, uterus, and vagina). This screening involves a pelvic examination, including checking for microscopic changes to the surface of your cervix (Pap test). You may be encouraged to have this screening done every 3 years, beginning at age 41.  For women ages 30-65, health care providers may recommend pelvic exams and Pap testing every 3 years, or they may recommend the Pap and pelvic exam, combined with testing for human papilloma virus (HPV), every 5 years. Some types of HPV increase your risk of cervical cancer. Testing for HPV may also be done on women of any age with unclear Pap test results.  Other health care providers may not recommend any screening for nonpregnant women who are considered low risk for pelvic cancer and who do not have symptoms. Ask your health care provider if a screening pelvic exam is right for you.  If you have had past treatment for cervical cancer or a condition that could lead to cancer, you need Pap tests and screening for cancer for at least 20 years after your treatment. If Pap tests have been discontinued, your risk factors (such as having a new sexual partner) need to be reassessed to determine if screening should resume. Some women have medical problems that increase the chance of getting cervical cancer. In these cases, your health care provider may recommend more frequent screening and Pap tests.  Colorectal Cancer  This type of cancer can be detected and often prevented.  Routine colorectal cancer screening usually begins at 46 years of age and continues through 46 years of age.  Your health care provider may recommend screening at an earlier age if you have risk factors for colon cancer.  Your health care provider may also  recommend using home test kits to check for hidden blood in the stool.  A small camera at the end of a tube can be used to examine your colon directly (sigmoidoscopy or colonoscopy). This is done to check for the earliest forms of colorectal cancer.  Routine screening usually begins at age 50.  Direct examination of the colon should be repeated every 5-10 years through 46 years of age. However, you may need to be screened more often if early forms of precancerous polyps or small growths are found.  Skin Cancer  Check your skin from head to toe regularly.  Tell your health care provider about any new moles or changes in moles, especially if there is a change in a mole's shape or color.  Also tell your health care provider if you have a mole that is larger than the size of a pencil eraser.  Always use sunscreen. Apply sunscreen liberally and repeatedly throughout the day.  Protect yourself by wearing long sleeves, pants, a wide-brimmed  hat, and sunglasses whenever you are outside.  Heart disease, diabetes, and high blood pressure  High blood pressure causes heart disease and increases the risk of stroke. High blood pressure is more likely to develop in: ? People who have blood pressure in the high end of the normal range (130-139/85-89 mm Hg). ? People who are overweight or obese. ? People who are African American.  If you are 69-90 years of age, have your blood pressure checked every 3-5 years. If you are 67 years of age or older, have your blood pressure checked every year. You should have your blood pressure measured twice-once when you are at a hospital or clinic, and once when you are not at a hospital or clinic. Record the average of the two measurements. To check your blood pressure when you are not at a hospital or clinic, you can use: ? An automated blood pressure machine at a pharmacy. ? A home blood pressure monitor.  If you are between 44 years and 25 years old, ask your  health care provider if you should take aspirin to prevent strokes.  Have regular diabetes screenings. This involves taking a blood sample to check your fasting blood sugar level. ? If you are at a normal weight and have a low risk for diabetes, have this test once every three years after 46 years of age. ? If you are overweight and have a high risk for diabetes, consider being tested at a younger age or more often. Preventing infection Hepatitis B  If you have a higher risk for hepatitis B, you should be screened for this virus. You are considered at high risk for hepatitis B if: ? You were born in a country where hepatitis B is common. Ask your health care provider which countries are considered high risk. ? Your parents were born in a high-risk country, and you have not been immunized against hepatitis B (hepatitis B vaccine). ? You have HIV or AIDS. ? You use needles to inject street drugs. ? You live with someone who has hepatitis B. ? You have had sex with someone who has hepatitis B. ? You get hemodialysis treatment. ? You take certain medicines for conditions, including cancer, organ transplantation, and autoimmune conditions.  Hepatitis C  Blood testing is recommended for: ? Everyone born from [birth year] through 1964. ? Anyone with known risk factors for hepatitis C.  Sexually transmitted infections (STIs)  You should be screened for sexually transmitted infections (STIs) including gonorrhea and chlamydia if: ? You are sexually active and are younger than 46 years of age. ? You are older than 46 years of age and your health care provider tells you that you are at risk for this type of infection. ? Your sexual activity has changed since you were last screened and you are at an increased risk for chlamydia or gonorrhea. Ask your health care provider if you are at risk.  If you do not have HIV, but are at risk, it may be recommended that you take a prescription medicine daily to  prevent HIV infection. This is called pre-exposure prophylaxis (PrEP). You are considered at risk if: ? You are sexually active and do not regularly use condoms or know the HIV status of your partner(s). ? You take drugs by injection. ? You are sexually active with a partner who has HIV.  Talk with your health care provider about whether you are at high risk of being infected with HIV. If you choose to begin  PrEP, you should first be tested for HIV. You should then be tested every 3 months for as long as you are taking PrEP. Pregnancy  If you are premenopausal and you may become pregnant, ask your health care provider about preconception counseling.  If you may become pregnant, take 400 to 800 micrograms (mcg) of folic acid every day.  If you want to prevent pregnancy, talk to your health care provider about birth control (contraception). Osteoporosis and menopause  Osteoporosis is a disease in which the bones lose minerals and strength with aging. This can result in serious bone fractures. Your risk for osteoporosis can be identified using a bone density scan.  If you are 25 years of age or older, or if you are at risk for osteoporosis and fractures, ask your health care provider if you should be screened.  Ask your health care provider whether you should take a calcium or vitamin D supplement to lower your risk for osteoporosis.  Menopause may have certain physical symptoms and risks.  Hormone replacement therapy may reduce some of these symptoms and risks. Talk to your health care provider about whether hormone replacement therapy is right for you. Follow these instructions at home:  Schedule regular health, dental, and eye exams.  Stay current with your immunizations.  Do not use any tobacco products including cigarettes, chewing tobacco, or electronic cigarettes.  If you are pregnant, do not drink alcohol.  If you are breastfeeding, limit how much and how often you drink  alcohol.  Limit alcohol intake to no more than 1 drink per day for nonpregnant women. One drink equals 12 ounces of beer, 5 ounces of wine, or 1 ounces of hard liquor.  Do not use street drugs.  Do not share needles.  Ask your health care provider for help if you need support or information about quitting drugs.  Tell your health care provider if you often feel depressed.  Tell your health care provider if you have ever been abused or do not feel safe at home. This information is not intended to replace advice given to you by your health care provider. Make sure you discuss any questions you have with your health care provider. Document Released: 05/27/2011 Document Revised: 04/18/2016 Document Reviewed: 08/15/2015 Elsevier Interactive Patient Education  [employer].

## 2018-01-07 ENCOUNTER — Encounter: Payer: Federal, State, Local not specified - PPO | Admitting: Family Medicine

## 2018-01-07 DIAGNOSIS — K080 Exfoliation of teeth due to systemic causes: Secondary | ICD-10-CM | POA: Diagnosis not present

## 2018-01-12 ENCOUNTER — Encounter: Payer: Self-pay | Admitting: Family Medicine

## 2018-01-22 DIAGNOSIS — Z1231 Encounter for screening mammogram for malignant neoplasm of breast: Secondary | ICD-10-CM | POA: Diagnosis not present

## 2018-02-06 ENCOUNTER — Encounter: Payer: Self-pay | Admitting: Family Medicine

## 2018-02-06 ENCOUNTER — Ambulatory Visit: Payer: Federal, State, Local not specified - PPO | Admitting: Family Medicine

## 2018-02-06 VITALS — BP 134/86 | HR 86 | Temp 98.9°F | Wt 161.0 lb

## 2018-02-06 DIAGNOSIS — J069 Acute upper respiratory infection, unspecified: Secondary | ICD-10-CM

## 2018-02-06 DIAGNOSIS — M25551 Pain in right hip: Secondary | ICD-10-CM | POA: Insufficient documentation

## 2018-02-06 NOTE — Assessment & Plan Note (Signed)
No clear sign of lumbar source. Most likely bursitis. Info on treatment given along with home PT.

## 2018-02-06 NOTE — Patient Instructions (Addendum)
Rest, fluids.  Can use mucinex DM twice daily as needed for cough and congestion.  Can use flonase for nasal congestion.  Call if shortness of breath or go to ER if severe.  Can use ibuprofen 800 mg with food every eight hours for pain and inflammation in hip.  Start home stretching.

## 2018-02-06 NOTE — Progress Notes (Signed)
   Subjective:    Patient ID: Jodi Young PlanFelicia W Rosiles, female    DOB: 10/05/1971, 47 y.o.   MRN: [id number]  Sore Throat   This is a new problem. The current episode started in the past 7 days ( 2 days). The problem has been gradually improving. Neither side of throat is experiencing more pain than the other. There has been no fever. Associated symptoms include congestion. Pertinent negatives include no coughing, diarrhea, ear pain, headaches, hoarse voice, shortness of breath, swollen glands or trouble swallowing. She has had no exposure to strep or mono. She has tried nothing for the symptoms.    Sick contact: cold    She has also been having left hip pain off and on for 4 months.. Lateral.. Pain only when there lying on it.  No fall, no change in activity.  Walking helps some  Nonsmoker  no pulmonary history.  Blood pressure 134/86, pulse 86, temperature 98.9 F (37.2 C), temperature source Oral, weight 161 lb (73 kg), SpO2 98 %.  Review of Systems  HENT: Positive for congestion. Negative for ear pain, hoarse voice and trouble swallowing.   Respiratory: Negative for cough and shortness of breath.   Gastrointestinal: Negative for diarrhea.  Neurological: Negative for headaches.       Objective:   Physical Exam  Constitutional: Vital signs are normal. She appears well-developed and well-nourished. She is cooperative.  Non-toxic appearance. She does not appear ill. No distress.  HENT:  Head: Normocephalic.  Right Ear: Hearing, tympanic membrane, external ear and ear canal normal. Tympanic membrane is not erythematous, not retracted and not bulging.  Left Ear: Hearing, tympanic membrane, external ear and ear canal normal. Tympanic membrane is not erythematous, not retracted and not bulging.  Nose: Mucosal edema and rhinorrhea present. Right sinus exhibits no maxillary sinus tenderness and no frontal sinus tenderness. Left sinus exhibits no maxillary sinus tenderness and no frontal sinus  tenderness.  Mouth/Throat: Uvula is midline, oropharynx is clear and moist and mucous membranes are normal.  Eyes: Conjunctivae, EOM and lids are normal. Pupils are equal, round, and reactive to light. Lids are everted and swept, no foreign bodies found.  Neck: Trachea normal and normal range of motion. Neck supple. Carotid bruit is not present. No thyroid mass and no thyromegaly present.  Cardiovascular: Normal rate, regular rhythm, S1 normal, S2 normal, normal heart sounds, intact distal pulses and normal pulses. Exam reveals no gallop and no friction rub.  No murmur heard. Pulmonary/Chest: Effort normal and breath sounds normal. No tachypnea. No respiratory distress. She has no decreased breath sounds. She has no wheezes. She has no rhonchi. She has no rales.  Musculoskeletal:       Right hip: She exhibits normal range of motion, normal strength, no tenderness and no bony tenderness.       Lumbar back: Normal. She exhibits normal range of motion and no tenderness.  Neg SLR, neg fabers  Slight ttp laterally  Neurological: She is alert.  Skin: Skin is warm, dry and intact. No rash noted.  Psychiatric: Her speech is normal and behavior is normal. Judgment normal. Her mood appears not anxious. Cognition and memory are normal. She does not exhibit a depressed mood.          Assessment & Plan:

## 2018-03-24 ENCOUNTER — Other Ambulatory Visit: Payer: Self-pay | Admitting: Family Medicine

## 2018-04-03 DIAGNOSIS — E042 Nontoxic multinodular goiter: Secondary | ICD-10-CM | POA: Diagnosis not present

## 2018-04-23 DIAGNOSIS — E042 Nontoxic multinodular goiter: Secondary | ICD-10-CM | POA: Diagnosis not present

## 2018-07-21 DIAGNOSIS — K080 Exfoliation of teeth due to systemic causes: Secondary | ICD-10-CM | POA: Diagnosis not present

## 2018-08-11 DIAGNOSIS — K080 Exfoliation of teeth due to systemic causes: Secondary | ICD-10-CM | POA: Diagnosis not present

## 2018-09-17 ENCOUNTER — Ambulatory Visit (INDEPENDENT_AMBULATORY_CARE_PROVIDER_SITE_OTHER): Payer: Federal, State, Local not specified - PPO

## 2018-09-17 DIAGNOSIS — Z23 Encounter for immunization: Secondary | ICD-10-CM

## 2018-10-25 LAB — HM PAP SMEAR: HM Pap smear: NORMAL

## 2018-11-16 DIAGNOSIS — Z1151 Encounter for screening for human papillomavirus (HPV): Secondary | ICD-10-CM | POA: Diagnosis not present

## 2018-11-16 DIAGNOSIS — Z6833 Body mass index (BMI) 33.0-33.9, adult: Secondary | ICD-10-CM | POA: Diagnosis not present

## 2018-11-16 DIAGNOSIS — Z01419 Encounter for gynecological examination (general) (routine) without abnormal findings: Secondary | ICD-10-CM | POA: Diagnosis not present

## 2019-01-14 ENCOUNTER — Telehealth: Payer: Self-pay | Admitting: Family Medicine

## 2019-01-14 NOTE — Telephone Encounter (Signed)
Left message asking pt to call office  °

## 2019-01-14 NOTE — Telephone Encounter (Signed)
E-scribed refill.  Pls schedule annual CPE. 

## 2019-01-20 NOTE — Telephone Encounter (Signed)
Labs 4/27 cpx 5/4

## 2019-01-20 NOTE — Telephone Encounter (Signed)
Noted  

## 2019-01-22 DIAGNOSIS — K080 Exfoliation of teeth due to systemic causes: Secondary | ICD-10-CM | POA: Diagnosis not present

## 2019-03-22 ENCOUNTER — Other Ambulatory Visit (INDEPENDENT_AMBULATORY_CARE_PROVIDER_SITE_OTHER): Payer: Federal, State, Local not specified - PPO

## 2019-03-22 ENCOUNTER — Other Ambulatory Visit: Payer: Self-pay | Admitting: Family Medicine

## 2019-03-22 ENCOUNTER — Other Ambulatory Visit: Payer: Self-pay

## 2019-03-22 DIAGNOSIS — E559 Vitamin D deficiency, unspecified: Secondary | ICD-10-CM

## 2019-03-22 DIAGNOSIS — D509 Iron deficiency anemia, unspecified: Secondary | ICD-10-CM

## 2019-03-22 DIAGNOSIS — I10 Essential (primary) hypertension: Secondary | ICD-10-CM

## 2019-03-22 DIAGNOSIS — E042 Nontoxic multinodular goiter: Secondary | ICD-10-CM | POA: Diagnosis not present

## 2019-03-22 LAB — MICROALBUMIN / CREATININE URINE RATIO
Creatinine,U: 14.6 mg/dL
Microalb Creat Ratio: 4.8 mg/g (ref 0.0–30.0)
Microalb, Ur: <0.7 mg/dL (ref 0.0–1.9)

## 2019-03-22 LAB — BASIC METABOLIC PANEL
BUN: 8 mg/dL (ref 6–23)
CO2: 28 mEq/L (ref 19–32)
Calcium: 9.4 mg/dL (ref 8.4–10.5)
Chloride: 100 mEq/L (ref 96–112)
Creatinine, Ser: 0.87 mg/dL (ref 0.40–1.20)
GFR: 84.35 mL/min (ref >60.00)
Glucose, Bld: 96 mg/dL (ref 70–99)
Potassium: 3.6 mEq/L (ref 3.5–5.1)
Sodium: 137 mEq/L (ref 135–145)

## 2019-03-22 LAB — CBC WITH DIFFERENTIAL/PLATELET
Basophils Absolute: 0 10*3/uL (ref 0.0–0.1)
Basophils Relative: 0.5 % (ref 0.0–3.0)
Eosinophils Absolute: 0.1 10*3/uL (ref 0.0–0.7)
Eosinophils Relative: 1.9 % (ref 0.0–5.0)
HCT: 38.9 % (ref 36.0–46.0)
Hemoglobin: 13 g/dL (ref 12.0–15.0)
Lymphocytes Relative: 38.4 % (ref 12.0–46.0)
Lymphs Abs: 2.5 10*3/uL (ref 0.7–4.0)
MCHC: 33.5 g/dL (ref 30.0–36.0)
MCV: 80.8 fl (ref 78.0–100.0)
Monocytes Absolute: 0.5 10*3/uL (ref 0.1–1.0)
Monocytes Relative: 6.9 % (ref 3.0–12.0)
Neutro Abs: 3.5 10*3/uL (ref 1.4–7.7)
Neutrophils Relative %: 52.3 % (ref 43.0–77.0)
Platelets: 258 10*3/uL (ref 150.0–400.0)
RBC: 4.81 Mil/uL (ref 3.87–5.11)
RDW: 13.1 % (ref 11.5–15.5)
WBC: 6.6 10*3/uL (ref 4.0–10.5)

## 2019-03-22 LAB — IBC PANEL
Iron: 86 ug/dL (ref 42–145)
Saturation Ratios: 21.9 % (ref 20.0–50.0)
Transferrin: 281 mg/dL (ref 212.0–360.0)

## 2019-03-22 LAB — FERRITIN: Ferritin: 21.7 ng/mL (ref 10.0–291.0)

## 2019-03-22 LAB — TSH: TSH: 2.28 u[IU]/mL (ref 0.35–4.50)

## 2019-03-22 LAB — VITAMIN D 25 HYDROXY (VIT D DEFICIENCY, FRACTURES): VITD: 36.98 ng/mL (ref 30.00–100.00)

## 2019-03-22 NOTE — Addendum Note (Signed)
Addended by: Alvina Chou on: 03/22/2019 11:52 AM   Modules accepted: Orders

## 2019-03-29 ENCOUNTER — Encounter: Payer: Self-pay | Admitting: Family Medicine

## 2019-03-29 ENCOUNTER — Ambulatory Visit (INDEPENDENT_AMBULATORY_CARE_PROVIDER_SITE_OTHER): Payer: Federal, State, Local not specified - PPO | Admitting: Family Medicine

## 2019-03-29 VITALS — BP 149/107 | HR 93 | Temp 97.8°F | Ht 59.0 in | Wt 160.0 lb

## 2019-03-29 DIAGNOSIS — E559 Vitamin D deficiency, unspecified: Secondary | ICD-10-CM | POA: Diagnosis not present

## 2019-03-29 DIAGNOSIS — Z0000 Encounter for general adult medical examination without abnormal findings: Secondary | ICD-10-CM | POA: Diagnosis not present

## 2019-03-29 DIAGNOSIS — I10 Essential (primary) hypertension: Secondary | ICD-10-CM

## 2019-03-29 DIAGNOSIS — D509 Iron deficiency anemia, unspecified: Secondary | ICD-10-CM

## 2019-03-29 DIAGNOSIS — E669 Obesity, unspecified: Secondary | ICD-10-CM

## 2019-03-29 MED ORDER — VITAMIN D3 25 MCG (1000 UT) PO CAPS: 1.0000 | ORAL_CAPSULE | Freq: Every day | ORAL | Status: DC

## 2019-03-29 MED ORDER — TRIAMTERENE-HCTZ 37.5-25 MG PO TABS: 1.0000 | ORAL_TABLET | Freq: Every day | ORAL | 3 refills | Status: DC

## 2019-03-29 MED ORDER — AMLODIPINE BESYLATE 5 MG PO TABS: 5.0000 mg | ORAL_TABLET | Freq: Every day | ORAL | 3 refills | Status: DC

## 2019-03-29 NOTE — Assessment & Plan Note (Signed)
Preventative protocols reviewed and updated unless pt declined. Discussed healthy diet and lifestyle.  

## 2019-03-29 NOTE — Assessment & Plan Note (Signed)
Continue to encourage healthy diet and lifestyle changes to affect sustainable weight loss.  

## 2019-03-29 NOTE — Assessment & Plan Note (Signed)
Levels better. Suggested continue 1000 IU daily.

## 2019-03-29 NOTE — Assessment & Plan Note (Addendum)
Chronic, deteriorated control despite maxzide. Will add amlodipine 5mg  daily. Reassess at 3 mo HTN f/u visit.

## 2019-03-29 NOTE — Assessment & Plan Note (Signed)
Resolved on latest check.

## 2019-03-29 NOTE — Progress Notes (Signed)
Virtual visit completed through [employer]. Due to national recommendations of social distancing due to COVID 19, a virtual visit is felt to be most appropriate for this patient at this time.   Patient location: home Provider location: [place] at [facility], office If any vitals were documented, they were collected by patient at home unless specified below.    BP (!) 149/107 (BP Location: Right Arm, Cuff Size: Normal)   Pulse 93   Temp 97.8 F (36.6 C) (Oral)   Ht 4\' 11"  (1.499 m)   Wt 160 lb (72.6 kg)   LMP 03/06/2019   BMI 32.32 kg/m    CC: virtual CPE  Subjective:    Patient ID: Jodi Young, female    DOB: Oct 25, 1971, 48 y.o.   MRN: [id number]  HPI: Jodi Young is a 48 y.o. female presenting on 03/29/2019 for Annual Exam   Able to work from home during this time.   Hypertension - on maxzide 37.5/25mg  daily. Home BP ranging 130/90s. No HA, vision changes, CP/tightness, SOB, leg swelling.   Preventative: Well womanwith OBGYNDr. Cousins last seen 10/2018 with normal pap.  Mammogram -2/2019with OBGYN - normal LMP - 03/06/2019. Cycles Q40 days. Possibly perimenopausal. H/o abnormal uterine shape. On climara patch for menstrual migraines - also uses aspirin and excedrin. 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 Interim medical history since our last visit reviewed. Allergies and medications reviewed and  updated. Outpatient Medications Prior to Visit  Medication Sig Dispense Refill  . estradiol (CLIMARA - DOSED IN MG/24 HR) 0.1 mg/24hr patch Place 0.1 mg onto the skin every 30 (thirty) days. Apply on day 22 of cycle for menstrual migraines as needed    . norethindrone (CAMILA) 0.35 MG tablet Take 1 tablet by mouth daily.    . Cholecalciferol (VITAMIN D) 2000 UNITS CAPS Take 1 capsule by mouth daily.    Marland Kitchen. triamterene-hydrochlorothiazide (MAXZIDE-25) 37.5-25 MG tablet TAKE 1 TABLET BY MOUTH EVERY DAY 90 tablet 0  . Cholecalciferol (VITAMIN D3) 50000 units CAPS TAKE 1 CAPSULE BY MOUTH ONE TIME PER WEEK (Patient not taking: Reported on 03/29/2019) 12 capsule 0   No facility-administered medications prior to visit.      Per HPI unless specifically indicated in ROS section below Review of Systems  Constitutional: Negative for activity change, appetite change, chills, fatigue, fever and unexpected weight change.  HENT: Negative for hearing loss.   Eyes: Negative for visual disturbance.  Respiratory: Negative for cough, chest tightness, shortness of breath and wheezing.   Cardiovascular: Negative for chest pain, palpitations and leg swelling.  Gastrointestinal: Negative for abdominal distention, abdominal pain, blood in stool, constipation, diarrhea, nausea and vomiting.  Genitourinary: Negative for difficulty urinating and hematuria.  Musculoskeletal: Negative for arthralgias, myalgias and neck pain.  Skin: Negative for rash.  Neurological: Negative for dizziness, seizures, syncope and headaches.  Hematological: Negative for adenopathy. Does not bruise/bleed easily.  Psychiatric/Behavioral: Negative for dysphoric mood. The patient is not  nervous/anxious.    Objective:    BP (!) 149/107 (BP Location: Right Arm, Cuff Size: Normal)   Pulse 93   Temp 97.8 F (36.6 C) (Oral)   Ht 4\' 11"  (1.499 m)   Wt 160 lb (72.6 kg)   LMP 03/06/2019   BMI 32.32 kg/m   Wt Readings from Last 3 Encounters:   03/29/19 160 lb (72.6 kg)  02/06/18 161 lb (73 kg)  01/06/18 160 lb (72.6 kg)     Physical exam: Gen: alert, NAD, not ill appearing Pulm: speaks in complete sentences without increased work of breathing Psych: normal mood, normal thought content      Results for orders placed or performed in visit on 03/29/19  HM PAP SMEAR  Result Value Ref Range   HM Pap smear normal    Assessment & Plan:  RTC 3 mo HTN f/u Problem List Items Addressed This Visit    Vitamin D deficiency    Levels better. Suggested continue 1000 IU daily.       Obesity, Class I, BMI 30-34.9    Continue to encourage healthy diet and lifestyle changes to affect sustainable weight loss.       Hypertension    Chronic, deteriorated control despite maxzide. Will add amlodipine 5mg  daily. Reassess at 3 mo HTN f/u visit.       Relevant Medications   triamterene-hydrochlorothiazide (MAXZIDE-25) 37.5-25 MG tablet   amLODipine (NORVASC) 5 MG tablet   Healthcare maintenance - Primary    Preventative protocols reviewed and updated unless pt declined. Discussed healthy diet and lifestyle.       Anemia, iron deficiency    Resolved on latest check.           Meds ordered this encounter  Medications  . triamterene-hydrochlorothiazide (MAXZIDE-25) 37.5-25 MG tablet    Sig: Take 1 tablet by mouth daily.    Dispense:  90 tablet    Refill:  3  . Cholecalciferol (VITAMIN D3) 25 MCG (1000 UT) CAPS    Sig: Take 1 capsule (1,000 Units total) by mouth daily.    Dispense:  30 capsule  . amLODipine (NORVASC) 5 MG tablet    Sig: Take 1 tablet (5 mg total) by mouth daily.    Dispense:  90 tablet    Refill:  3   Orders Placed This Encounter  Procedures  . HM PAP SMEAR    This external order was created through the Results Console.    Follow up plan: Return in about 3 months (around 06/29/2019) for follow up visit.  Eustaquio Boyden, MD

## 2019-06-24 DIAGNOSIS — N760 Acute vaginitis: Secondary | ICD-10-CM | POA: Diagnosis not present

## 2019-06-24 DIAGNOSIS — M549 Dorsalgia, unspecified: Secondary | ICD-10-CM | POA: Diagnosis not present

## 2019-07-26 IMAGING — CT CT HEAD W/O CM
3 series · 14 of 33 positions shown, 17 images · non-contrast
Comparison: None.

CLINICAL DATA: Episodic vertigo

EXAM:
CT HEAD WITHOUT CONTRAST
TECHNIQUE: Contiguous axial images were obtained from the base of the skull
through the vertex without intravenous contrast.

[Series 2: head 5.0 h37s · axial · 0.39mm/px · z∈[+1417,+1517]mm · 6 of 28 slices shown, 8 images]
[im 5/28  soft-tissue]
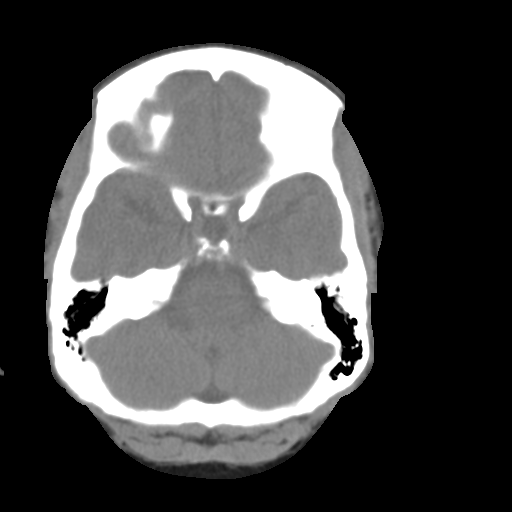
[im 5/28  bone]
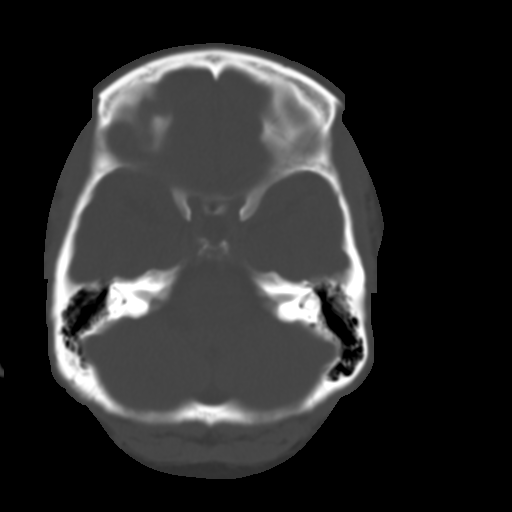
[im 9/28  bone]
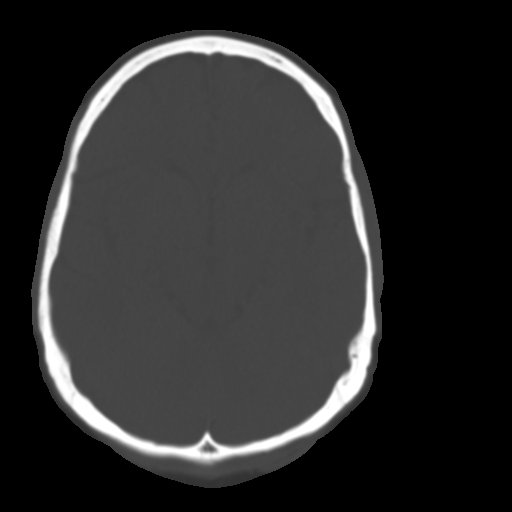
[im 13/28  bone]
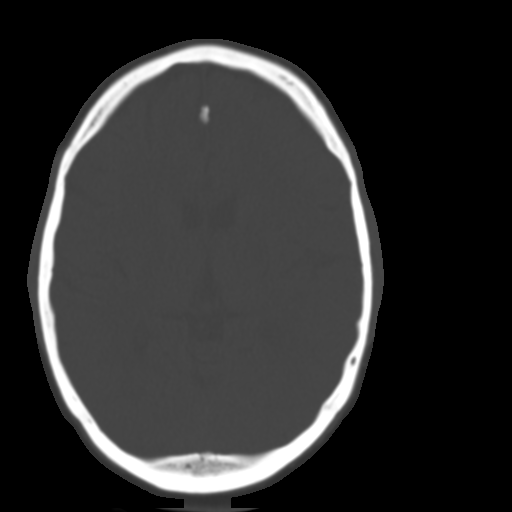
[im 17/28  bone]
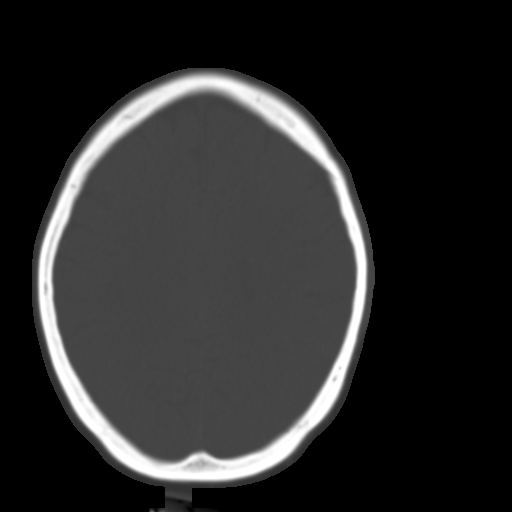
[im 21/28  soft-tissue]
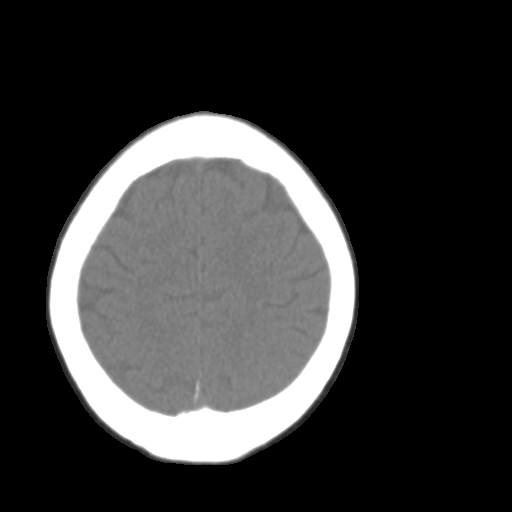
[im 21/28  bone]
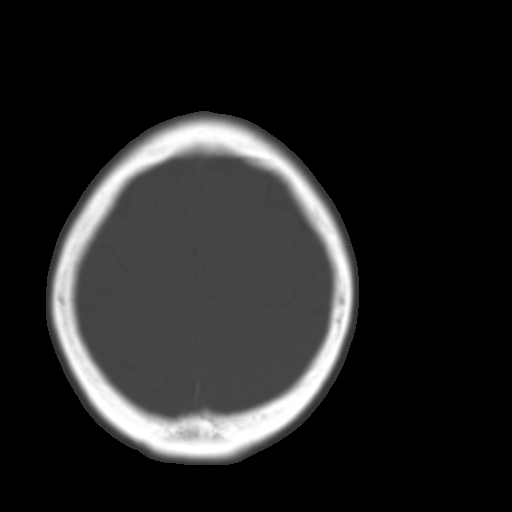
[im 25/28  bone]
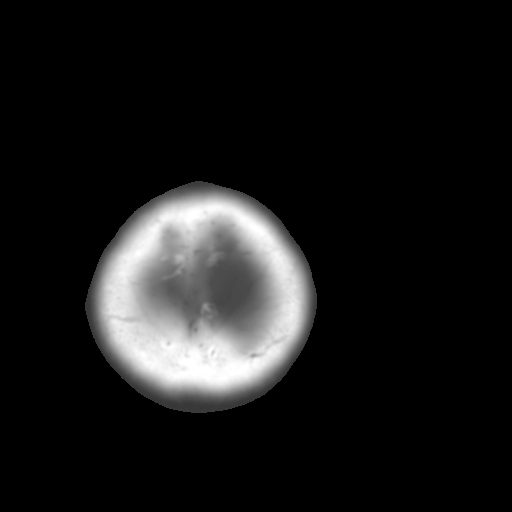

[Series 4: head 3.0 mpr cor · coronal · 0.26mm/px · 3 of 64 slices shown]
[im 13/64  bone]
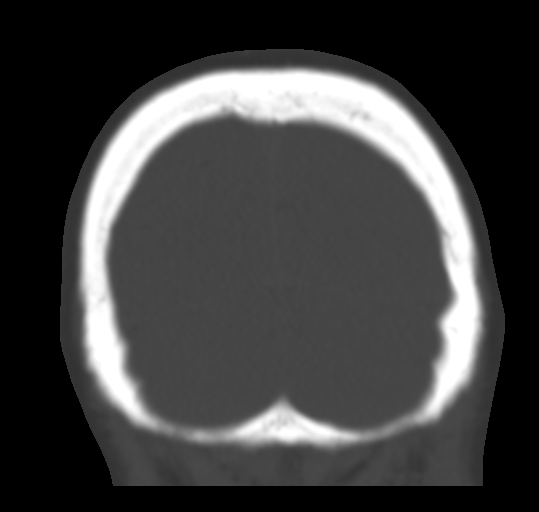
[im 26/64  bone]
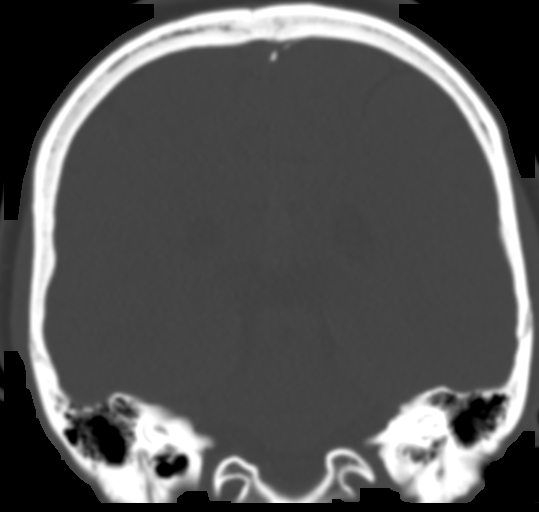
[im 38/64  bone]
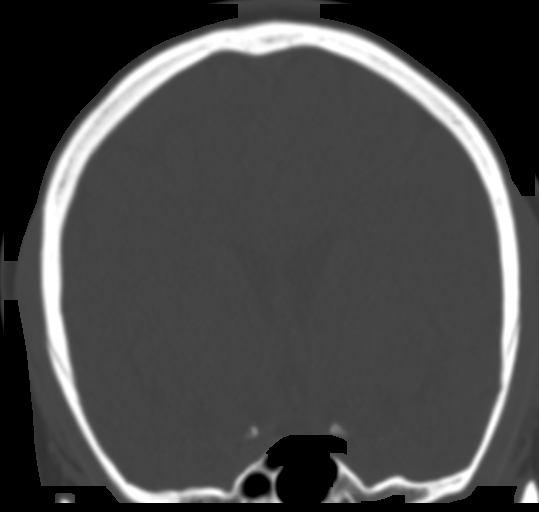

[Series 5: head 3.0 mpr sag · sagittal · 0.25mm/px · 5 of 48 slices shown, 6 images]
[im 16/48  bone]
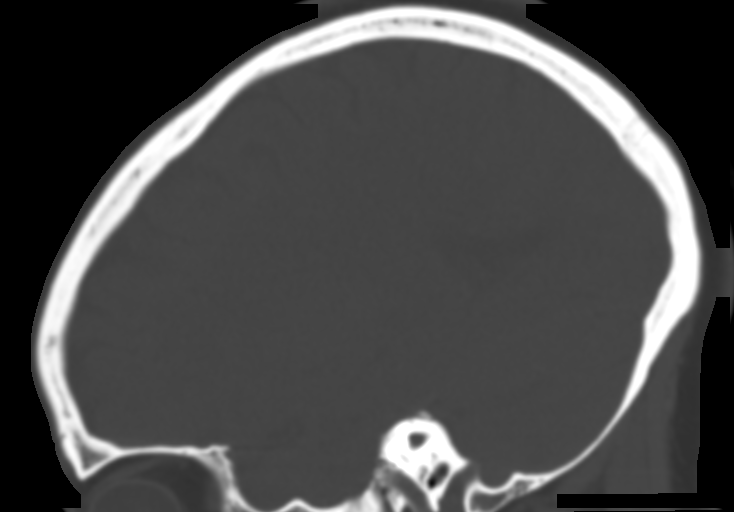
[im 20/48  bone]
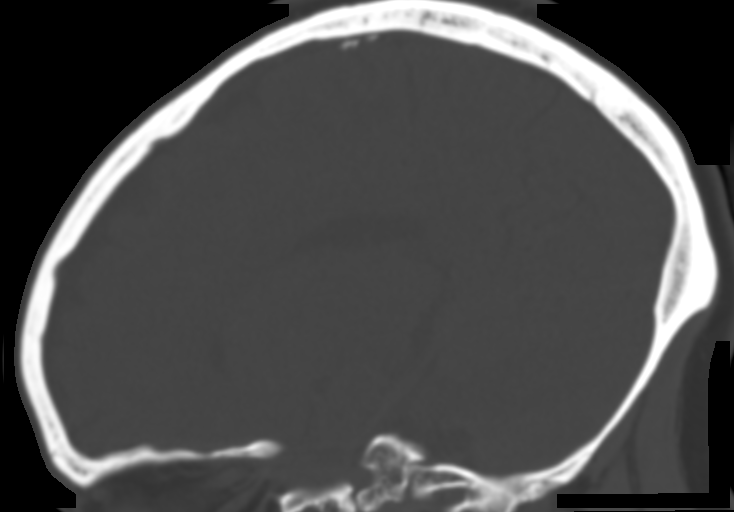
[im 24/48  soft-tissue]
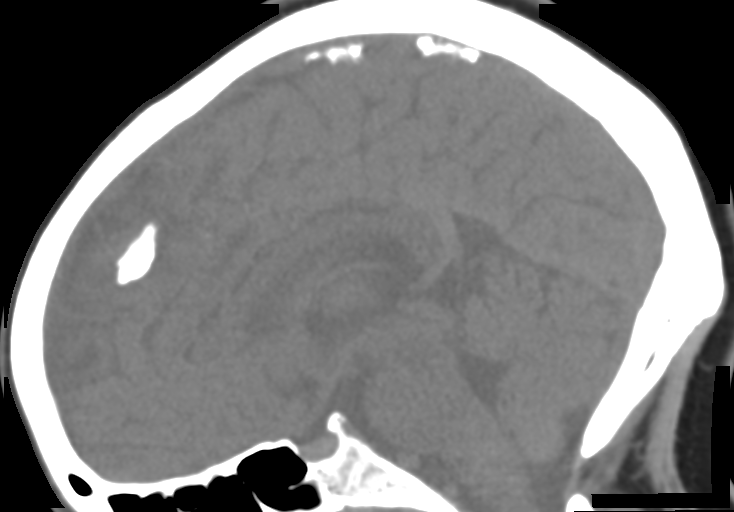
[im 24/48  bone]
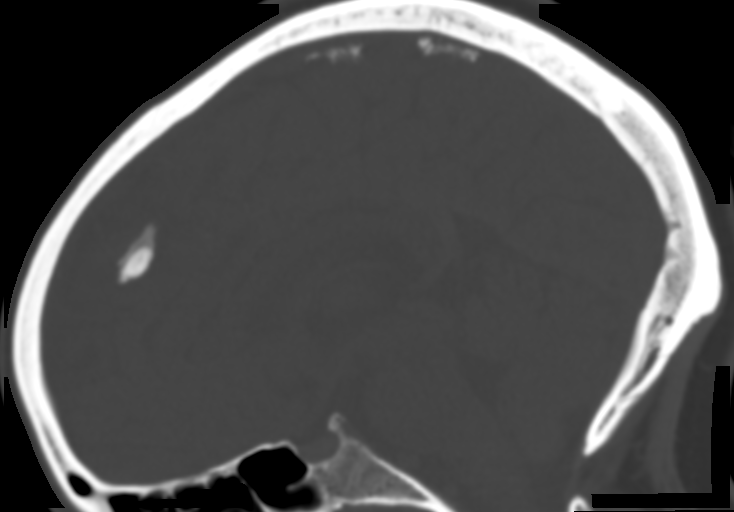
[im 28/48  bone]
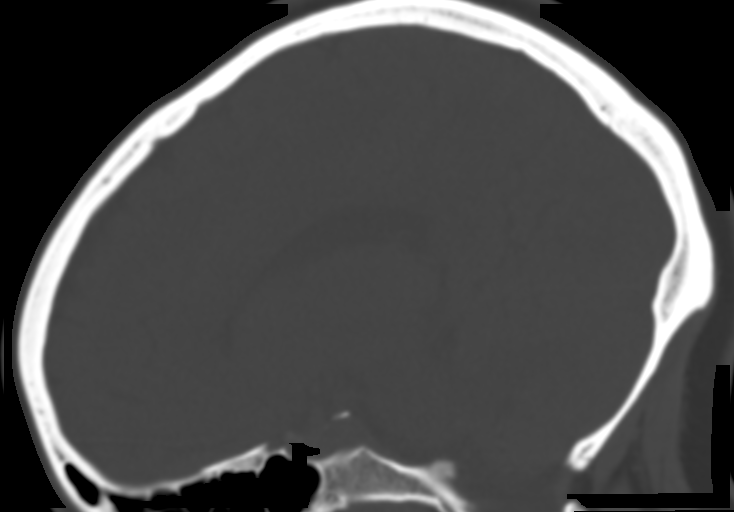
[im 32/48  bone]
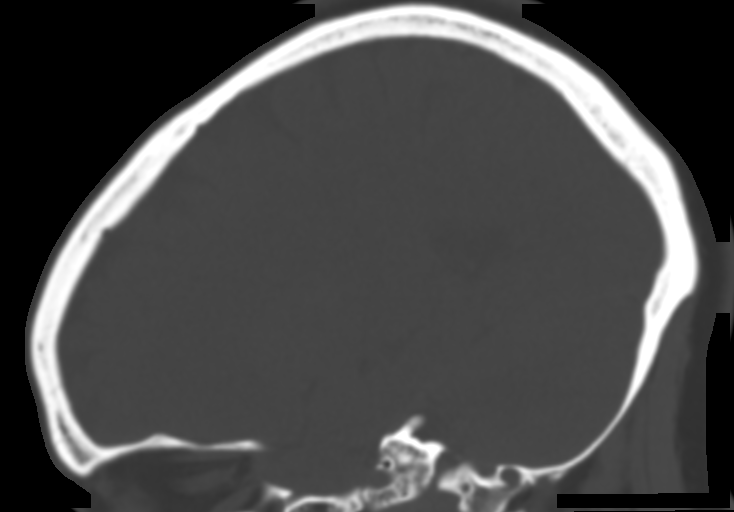

[14 of 33 positions shown; findings below may reference images not displayed]

FINDINGS: Brain: The ventricles are normal in size and configuration. There is
no intracranial mass, hemorrhage, extra-axial fluid collection, or
midline shift. Gray-white compartments appear normal. No acute
infarct evident.

Vascular: There is no hyperdense vessel. There is no appreciable
vascular calcification.

Skull: Bony calvarium appears intact.

Sinuses/Orbits: Visualized paranasal sinuses are clear. Visualized
orbits appear symmetric bilaterally.

Other: Visualized mastoid air cells are clear.
IMPRESSION: Study within normal limits.

## 2019-08-31 ENCOUNTER — Encounter: Payer: Self-pay | Admitting: Family Medicine

## 2019-08-31 ENCOUNTER — Other Ambulatory Visit: Payer: Self-pay

## 2019-08-31 ENCOUNTER — Ambulatory Visit: Payer: Federal, State, Local not specified - PPO

## 2019-08-31 ENCOUNTER — Ambulatory Visit (INDEPENDENT_AMBULATORY_CARE_PROVIDER_SITE_OTHER): Payer: Federal, State, Local not specified - PPO | Admitting: Family Medicine

## 2019-08-31 VITALS — BP 130/84 | HR 86 | Temp 98.0°F | Ht 59.0 in | Wt 164.3 lb

## 2019-08-31 DIAGNOSIS — Z23 Encounter for immunization: Secondary | ICD-10-CM | POA: Diagnosis not present

## 2019-08-31 DIAGNOSIS — I10 Essential (primary) hypertension: Secondary | ICD-10-CM | POA: Diagnosis not present

## 2019-08-31 MED ORDER — AMLODIPINE BESYLATE 10 MG PO TABS: 10.0000 mg | ORAL_TABLET | Freq: Every day | ORAL | 1 refills | Status: DC

## 2019-08-31 NOTE — Progress Notes (Signed)
This visit was conducted in person.  BP 130/84 (BP Location: Left Arm, Patient Position: Sitting, Cuff Size: Normal)   Pulse 86   Temp 98 F (36.7 C) (Temporal)   Ht 4\' 11"  (1.499 m)   Wt 164 lb 5 oz (74.5 kg)   LMP 07/20/2019   SpO2 99%   BMI 33.19 kg/m   On recheck, bp 150/100  CC: 3 mo f/u visit Subjective:    Patient ID: Jodi Young, female    DOB: 1971-04-08, 49 y.o.   MRN: [id number]  HPI: Jodi Young is a 48 y.o. female presenting on 08/31/2019 for Hypertension (Here for 3 mo f/u.)   HTN - Compliant with current antihypertensive regimen of amlodipine 5mg  daily (evenings), maxzide 37.5/25mg  daily. Does check blood pressures at home: 130/90s. No low blood pressure readings or symptoms of dizziness/syncope. Denies HA, vision changes, CP/tightness, SOB, leg swelling. She does limit salt in diet.   Menstrual migraines on climara patch for this. Also uses aspirin or excedrin for headaches.  Cycles Q40 days.      Relevant past medical, surgical, family and social history reviewed and updated as indicated. Interim medical history since our last visit reviewed. Allergies and medications reviewed and updated. Outpatient Medications Prior to Visit  Medication Sig Dispense Refill  . Cholecalciferol (VITAMIN D3) 25 MCG (1000 UT) CAPS Take 1 capsule (1,000 Units total) by mouth daily. 30 capsule   . estradiol (CLIMARA - DOSED IN MG/24 HR) 0.1 mg/24hr patch Place 0.1 mg onto the skin every 30 (thirty) days. Apply on day 22 of cycle for menstrual migraines as needed    . Magnesium 250 MG TABS Take 1 tablet by mouth daily.    . norethindrone (CAMILA) 0.35 MG tablet Take 1 tablet by mouth daily.    Marland Kitchen triamterene-hydrochlorothiazide (MAXZIDE-25) 37.5-25 MG tablet Take 1 tablet by mouth daily. 90 tablet 3  . amLODipine (NORVASC) 5 MG tablet Take 1 tablet (5 mg total) by mouth daily. 90 tablet 3   No facility-administered medications prior to visit.      Per HPI unless  specifically indicated in ROS section below Review of Systems Objective:    BP 130/84 (BP Location: Left Arm, Patient Position: Sitting, Cuff Size: Normal)   Pulse 86   Temp 98 F (36.7 C) (Temporal)   Ht 4\' 11"  (1.499 m)   Wt 164 lb 5 oz (74.5 kg)   LMP 07/20/2019   SpO2 99%   BMI 33.19 kg/m   Wt Readings from Last 3 Encounters:  08/31/19 164 lb 5 oz (74.5 kg)  03/29/19 160 lb (72.6 kg)  02/06/18 161 lb (73 kg)    Physical Exam Vitals signs and nursing note reviewed.  Constitutional:      General: She is not in acute distress.    Appearance: Normal appearance. She is obese. She is not ill-appearing.  HENT:     Mouth/Throat:     Mouth: Mucous membranes are moist.     Pharynx: Oropharynx is clear. No posterior oropharyngeal erythema.  Eyes:     Extraocular Movements: Extraocular movements intact.     Pupils: Pupils are equal, round, and reactive to light.  Cardiovascular:     Rate and Rhythm: Normal rate and regular rhythm.     Pulses: Normal pulses.     Heart sounds: Normal heart sounds. No murmur.  Pulmonary:     Effort: Pulmonary effort is normal. No respiratory distress.     Breath sounds: Normal breath sounds.  No wheezing, rhonchi or rales.  Musculoskeletal:     Right lower leg: No edema.     Left lower leg: No edema.  Neurological:     Mental Status: She is alert.  Psychiatric:        Mood and Affect: Mood normal.        Behavior: Behavior normal.       Results for orders placed or performed in visit on 03/29/19  HM PAP SMEAR  Result Value Ref Range   HM Pap smear normal    Assessment & Plan:   Problem List Items Addressed This Visit    Hypertension - Primary    Chronic, remains above goal. Increase amlodipine to 10mg  daily. DASH diet handout provided today. RTC 2-3 mo f/u visit.       Relevant Medications   amLODipine (NORVASC) 10 MG tablet    Other Visit Diagnoses    Need for influenza vaccination       Relevant Orders   Flu Vaccine QUAD 36+  mos IM (Completed)       Meds ordered this encounter  Medications  . amLODipine (NORVASC) 10 MG tablet    Sig: Take 1 tablet (10 mg total) by mouth daily.    Dispense:  90 tablet    Refill:  1    Note change in dose   Orders Placed This Encounter  Procedures  . Flu Vaccine QUAD 36+ mos IM    Follow up plan: Return in about 3 months (around 12/01/2019) for follow up visit.  01/29/2020, MD

## 2019-08-31 NOTE — Assessment & Plan Note (Signed)
Chronic, remains above goal. Increase amlodipine to 10mg  daily. DASH diet handout provided today. RTC 2-3 mo f/u visit.

## 2019-08-31 NOTE — Patient Instructions (Addendum)
Flu shot today Blood pressure is staying a bit elevated - increase amlodipine to 10mg  daily - continue maxzide.  Your goal blood pressure is <140/90, ideally >135/85.  Work on low salt/sodium diet - goal <1.5gm (1,500mg ) per day. Eat a diet high in fruits/vegetables and whole grains.  Look into mediterranean and DASH diet. Goal activity is 131min/wk of moderate intensity exercise.  This can be split into 30 minute chunks.  If you are not at this level, you can start with smaller 10-15 min increments and slowly build up activity. Look at [employer].org for more resources  Return in 3 months for BP f/u visit.   DASH Eating Plan DASH stands for "Dietary Approaches to Stop Hypertension." The DASH eating plan is a healthy eating plan that has been shown to reduce high blood pressure (hypertension). It may also reduce your risk for type 2 diabetes, heart disease, and stroke. The DASH eating plan may also help with weight loss. What are tips for following this plan?  General guidelines  Avoid eating more than 2,300 mg (milligrams) of salt (sodium) a day. If you have hypertension, you may need to reduce your sodium intake to 1,500 mg a day.  Limit alcohol intake to no more than 1 drink a day for nonpregnant women and 2 drinks a day for men. One drink equals 12 oz of beer, 5 oz of wine, or 1 oz of hard liquor.  Work with your health care provider to maintain a healthy body weight or to lose weight. Ask what an ideal weight is for you.  Get at least 30 minutes of exercise that causes your heart to beat faster (aerobic exercise) most days of the week. Activities may include walking, swimming, or biking.  Work with your health care provider or diet and nutrition specialist (dietitian) to adjust your eating plan to your individual calorie needs. Reading food labels   Check food labels for the amount of sodium per serving. Choose foods with less than 5 percent of the Daily Value of sodium. Generally,  foods with less than 300 mg of sodium per serving fit into this eating plan.  To find whole grains, look for the word "whole" as the first word in the ingredient list. Shopping  Buy products labeled as "low-sodium" or "no salt added."  Buy fresh foods. Avoid canned foods and premade or frozen meals. Cooking  Avoid adding salt when cooking. Use salt-free seasonings or herbs instead of table salt or sea salt. Check with your health care provider or pharmacist before using salt substitutes.  Do not fry foods. Cook foods using healthy methods such as baking, boiling, grilling, and broiling instead.  Cook with heart-healthy oils, such as olive, canola, soybean, or sunflower oil. Meal planning  Eat a balanced diet that includes: ? 5 or more servings of fruits and vegetables each day. At each meal, try to fill half of your plate with fruits and vegetables. ? Up to 6-8 servings of whole grains each day. ? Less than 6 oz of lean meat, poultry, or fish each day. A 3-oz serving of meat is about the same size as a deck of cards. One egg equals 1 oz. ? 2 servings of low-fat dairy each day. ? A serving of nuts, seeds, or beans 5 times each week. ? Heart-healthy fats. Healthy fats called Omega-3 fatty acids are found in foods such as flaxseeds and coldwater fish, like sardines, salmon, and mackerel.  Limit how much you eat of the following: ?  Canned or prepackaged foods. ? Food that is high in trans fat, such as fried foods. ? Food that is high in saturated fat, such as fatty meat. ? Sweets, desserts, sugary drinks, and other foods with added sugar. ? Full-fat dairy products.  Do not salt foods before eating.  Try to eat at least 2 vegetarian meals each week.  Eat more home-cooked food and less restaurant, buffet, and fast food.  When eating at a restaurant, ask that your food be prepared with less salt or no salt, if possible. What foods are recommended? The items listed may not be a  complete list. Talk with your dietitian about what dietary choices are best for you. Grains Whole-grain or whole-wheat bread. Whole-grain or whole-wheat pasta. Brown rice. [place]. Bulgur. Whole-grain and low-sodium cereals. Pita bread. Low-fat, low-sodium crackers. Whole-wheat flour tortillas. Vegetables Fresh or frozen vegetables (raw, steamed, roasted, or grilled). Low-sodium or reduced-sodium tomato and vegetable juice. Low-sodium or reduced-sodium tomato sauce and tomato paste. Low-sodium or reduced-sodium canned vegetables. Fruits All fresh, dried, or frozen fruit. Canned fruit in natural juice (without added sugar). Meat and other protein foods Skinless chicken or [place]. Ground chicken or [place]. Pork with fat trimmed off. Fish and seafood. Egg whites. Dried beans, peas, or lentils. Unsalted nuts, nut butters, and seeds. Unsalted canned beans. Lean cuts of beef with fat trimmed off. Low-sodium, lean deli meat. Dairy Low-fat (1%) or fat-free (skim) milk. Fat-free, low-fat, or reduced-fat cheeses. Nonfat, low-sodium ricotta or cottage cheese. Low-fat or nonfat yogurt. Low-fat, low-sodium cheese. Fats and oils Soft margarine without trans fats. Vegetable oil. Low-fat, reduced-fat, or light mayonnaise and salad dressings (reduced-sodium). Canola, safflower, olive, soybean, and sunflower oils. Avocado. Seasoning and other foods Herbs. Spices. Seasoning mixes without salt. Unsalted popcorn and pretzels. Fat-free sweets. What foods are not recommended? The items listed may not be a complete list. Talk with your dietitian about what dietary choices are best for you. Grains Baked goods made with fat, such as croissants, muffins, or some breads. Dry pasta or rice meal packs. Vegetables Creamed or fried vegetables. Vegetables in a cheese sauce. Regular canned vegetables (not low-sodium or reduced-sodium). Regular canned tomato sauce and paste (not low-sodium or reduced-sodium). Regular  tomato and vegetable juice (not low-sodium or reduced-sodium). Angie Fava. Olives. Fruits Canned fruit in a light or heavy syrup. Fried fruit. Fruit in cream or butter sauce. Meat and other protein foods Fatty cuts of meat. Ribs. Fried meat. Berniece Salines. Sausage. Bologna and other processed lunch meats. Salami. Fatback. Hotdogs. Bratwurst. Salted nuts and seeds. Canned beans with added salt. Canned or smoked fish. Whole eggs or egg yolks. Chicken or [place] with skin. Dairy Whole or 2% milk, cream, and half-and-half. Whole or full-fat cream cheese. Whole-fat or sweetened yogurt. Full-fat cheese. Nondairy creamers. Whipped toppings. Processed cheese and cheese spreads. Fats and oils Butter. Stick margarine. Lard. Shortening. Ghee. Bacon fat. Tropical oils, such as coconut, palm kernel, or palm oil. Seasoning and other foods Salted popcorn and pretzels. Onion salt, garlic salt, seasoned salt, table salt, and sea salt. Worcestershire sauce. Tartar sauce. Barbecue sauce. Teriyaki sauce. Soy sauce, including reduced-sodium. Steak sauce. Canned and packaged gravies. Fish sauce. Oyster sauce. Cocktail sauce. Horseradish that you find on the shelf. Ketchup. Mustard. Meat flavorings and tenderizers. Bouillon cubes. Hot sauce and Tabasco sauce. Premade or packaged marinades. Premade or packaged taco seasonings. Relishes. Regular salad dressings. Where to find more information:  National Heart, Lung, and [place]: https://wilson-eaton.com/  American Heart Association: www.heart.org Summary  The DASH eating plan is a healthy eating plan that has been shown to reduce high blood pressure (hypertension). It may also reduce your risk for type 2 diabetes, heart disease, and stroke.  With the DASH eating plan, you should limit salt (sodium) intake to 2,300 mg a day. If you have hypertension, you may need to reduce your sodium intake to 1,500 mg a day.  When on the DASH eating plan, aim to eat more fresh fruits and  vegetables, whole grains, lean proteins, low-fat dairy, and heart-healthy fats.  Work with your health care provider or diet and nutrition specialist (dietitian) to adjust your eating plan to your individual calorie needs. This information is not intended to replace advice given to you by your health care provider. Make sure you discuss any questions you have with your health care provider. Document Released: 10/31/2011 Document Revised: 10/24/2017 Document Reviewed: 11/04/2016 Elsevier Patient Education  2020 [employer].

## 2019-09-15 ENCOUNTER — Encounter: Payer: Self-pay | Admitting: Family Medicine

## 2019-09-15 DIAGNOSIS — Z7189 Other specified counseling: Secondary | ICD-10-CM | POA: Insufficient documentation

## 2019-09-15 DIAGNOSIS — Z531 Procedure and treatment not carried out because of patient's decision for reasons of belief and group pressure: Secondary | ICD-10-CM | POA: Insufficient documentation

## 2019-09-15 HISTORY — DX: Procedure and treatment not carried out because of patient's decision for reasons of belief and group pressure: Z53.1

## 2019-09-28 DIAGNOSIS — K080 Exfoliation of teeth due to systemic causes: Secondary | ICD-10-CM | POA: Diagnosis not present

## 2019-12-01 ENCOUNTER — Other Ambulatory Visit: Payer: Self-pay

## 2019-12-01 ENCOUNTER — Ambulatory Visit: Payer: Federal, State, Local not specified - PPO | Admitting: Family Medicine

## 2019-12-01 ENCOUNTER — Encounter: Payer: Self-pay | Admitting: Family Medicine

## 2019-12-01 DIAGNOSIS — I10 Essential (primary) hypertension: Secondary | ICD-10-CM

## 2019-12-01 NOTE — Assessment & Plan Note (Signed)
Chronic, improved with recent increase in amlodipine to 10mg  daily - continue this and maxzide. Continue low salt diet.  RTC 4 mo CPE.

## 2019-12-01 NOTE — Progress Notes (Signed)
This visit was conducted in person.  BP 124/86 (BP Location: Left Arm, Patient Position: Sitting, Cuff Size: Normal)   Pulse 94   Temp 97.9 F (36.6 C) (Temporal)   Wt 165 lb 14.4 oz (75.3 kg)   SpO2 99%   BMI 33.51 kg/m    CC: 3 mo f/u visit Subjective:    Patient ID: Jodi Young, female    DOB: 11/02/1971, 49 y.o.   MRN: [id number]  HPI: Jodi Young is a 49 y.o. female presenting on 12/01/2019 for Follow-up   See prior note for details - seen here last month with elevated blood pressures despite regimen of amlodipine 5mg , maxzide. Amlodipine was increased to 10mg  daily. Some ankle swelling with higher dose. No HA, vision changes, CP/tightness, SOB.   Home BP readings - best 111/80 largely 409 systolic.      Relevant past medical, surgical, family and social history reviewed and updated as indicated. Interim medical history since our last visit reviewed. Allergies and medications reviewed and updated. Outpatient Medications Prior to Visit  Medication Sig Dispense Refill  . amLODipine (NORVASC) 10 MG tablet Take 1 tablet (10 mg total) by mouth daily. 90 tablet 1  . Cholecalciferol (VITAMIN D3) 25 MCG (1000 UT) CAPS Take 1 capsule (1,000 Units total) by mouth daily. 30 capsule   . estradiol (CLIMARA - DOSED IN MG/24 HR) 0.1 mg/24hr patch Place 0.1 mg onto the skin every 30 (thirty) days. Apply on day 22 of cycle for menstrual migraines as needed    . Magnesium 250 MG TABS Take 1 tablet by mouth daily.    . norethindrone (CAMILA) 0.35 MG tablet Take 1 tablet by mouth daily.    Marland Kitchen triamterene-hydrochlorothiazide (MAXZIDE-25) 37.5-25 MG tablet Take 1 tablet by mouth daily. 90 tablet 3   No facility-administered medications prior to visit.     Per HPI unless specifically indicated in ROS section below Review of Systems Objective:    BP 124/86 (BP Location: Left Arm, Patient Position: Sitting, Cuff Size: Normal)   Pulse 94   Temp 97.9 F (36.6 C) (Temporal)   Wt  165 lb 14.4 oz (75.3 kg)   SpO2 99%   BMI 33.51 kg/m   Wt Readings from Last 3 Encounters:  12/01/19 165 lb 14.4 oz (75.3 kg)  08/31/19 164 lb 5 oz (74.5 kg)  03/29/19 160 lb (72.6 kg)    Physical Exam Vitals and nursing note reviewed.  Constitutional:      Appearance: Normal appearance. She is obese. She is not ill-appearing.  Cardiovascular:     Rate and Rhythm: Normal rate and regular rhythm.     Pulses: Normal pulses.     Heart sounds: Normal heart sounds. No murmur.  Pulmonary:     Effort: Pulmonary effort is normal. No respiratory distress.     Breath sounds: Normal breath sounds. No wheezing, rhonchi or rales.  Musculoskeletal:        General: Swelling present.     Right lower leg: Edema present.     Left lower leg: Edema present.     Comments: Tr nonpitting pedal edema  Neurological:     Mental Status: She is alert.       Results for orders placed or performed in visit on 03/29/19  HM PAP SMEAR  Result Value Ref Range   HM Pap smear normal    Assessment & Plan:  This visit occurred during the SARS-CoV-2 public health emergency.  Safety protocols were in place, including  screening questions prior to the visit, additional usage of staff PPE, and extensive cleaning of exam room while observing appropriate contact time as indicated for disinfecting solutions.   Problem List Items Addressed This Visit    Hypertension    Chronic, improved with recent increase in amlodipine to 10mg  daily - continue this and maxzide. Continue low salt diet.  RTC 4 mo CPE.           No orders of the defined types were placed in this encounter.  No orders of the defined types were placed in this encounter.   Patient Instructions  Blood pressures are much better today - good job! Continue current regimen of higher amlodipine dose - up to 10mg . Return in 4 months for physical.    Follow up plan: Return in about 4 months (around 03/30/2020) for annual exam, prior fasting for blood  work.  , MD

## 2019-12-01 NOTE — Patient Instructions (Addendum)
Blood pressures are much better today - good job! Continue current regimen of higher amlodipine dose - up to 10mg . Return in 4 months for physical.

## 2020-02-08 DIAGNOSIS — Z6833 Body mass index (BMI) 33.0-33.9, adult: Secondary | ICD-10-CM | POA: Diagnosis not present

## 2020-02-08 DIAGNOSIS — Z1151 Encounter for screening for human papillomavirus (HPV): Secondary | ICD-10-CM | POA: Diagnosis not present

## 2020-02-08 DIAGNOSIS — Z01419 Encounter for gynecological examination (general) (routine) without abnormal findings: Secondary | ICD-10-CM | POA: Diagnosis not present

## 2020-02-08 DIAGNOSIS — Z23 Encounter for immunization: Secondary | ICD-10-CM | POA: Diagnosis not present

## 2020-02-08 DIAGNOSIS — Z1231 Encounter for screening mammogram for malignant neoplasm of breast: Secondary | ICD-10-CM | POA: Diagnosis not present

## 2020-02-08 LAB — HM MAMMOGRAPHY

## 2020-02-10 ENCOUNTER — Encounter: Payer: Self-pay | Admitting: Family Medicine

## 2020-02-28 ENCOUNTER — Other Ambulatory Visit: Payer: Self-pay | Admitting: Family Medicine

## 2020-03-24 ENCOUNTER — Other Ambulatory Visit: Payer: Self-pay | Admitting: Family Medicine

## 2020-03-24 NOTE — Telephone Encounter (Signed)
E-scribed refill.  Plz schedule CPE and lab visits. 

## 2020-03-27 ENCOUNTER — Telehealth: Payer: Self-pay | Admitting: Family Medicine

## 2020-03-27 NOTE — Telephone Encounter (Signed)
Called patient and left message for patient to call back and schedule cpe and labs.

## 2020-03-29 ENCOUNTER — Other Ambulatory Visit: Payer: Self-pay | Admitting: Family Medicine

## 2020-03-29 ENCOUNTER — Other Ambulatory Visit (INDEPENDENT_AMBULATORY_CARE_PROVIDER_SITE_OTHER): Payer: Federal, State, Local not specified - PPO

## 2020-03-29 DIAGNOSIS — D509 Iron deficiency anemia, unspecified: Secondary | ICD-10-CM

## 2020-03-29 DIAGNOSIS — E559 Vitamin D deficiency, unspecified: Secondary | ICD-10-CM

## 2020-03-29 DIAGNOSIS — E042 Nontoxic multinodular goiter: Secondary | ICD-10-CM

## 2020-03-29 DIAGNOSIS — I10 Essential (primary) hypertension: Secondary | ICD-10-CM

## 2020-03-29 LAB — COMPREHENSIVE METABOLIC PANEL
ALT: 11 U/L (ref 0–35)
AST: 12 U/L (ref 0–37)
Albumin: 4.2 g/dL (ref 3.5–5.2)
Alkaline Phosphatase: 64 U/L (ref 39–117)
BUN: 7 mg/dL (ref 6–23)
CO2: 30 mEq/L (ref 19–32)
Calcium: 9.4 mg/dL (ref 8.4–10.5)
Chloride: 99 mEq/L (ref 96–112)
Creatinine, Ser: 0.88 mg/dL (ref 0.40–1.20)
GFR: 82.89 mL/min (ref >60.00)
Glucose, Bld: 101 mg/dL — ABNORMAL HIGH (ref 70–99)
Potassium: 3.6 mEq/L (ref 3.5–5.1)
Sodium: 135 mEq/L (ref 135–145)
Total Bilirubin: 0.5 mg/dL (ref 0.2–1.2)
Total Protein: 7.2 g/dL (ref 6.0–8.3)

## 2020-03-29 LAB — CBC WITH DIFFERENTIAL/PLATELET
Basophils Absolute: 0 10*3/uL (ref 0.0–0.1)
Basophils Relative: 0.5 % (ref 0.0–3.0)
Eosinophils Absolute: 0.1 10*3/uL (ref 0.0–0.7)
Eosinophils Relative: 1.4 % (ref 0.0–5.0)
HCT: 36.8 % (ref 36.0–46.0)
Hemoglobin: 12.2 g/dL (ref 12.0–15.0)
Lymphocytes Relative: 35.8 % (ref 12.0–46.0)
Lymphs Abs: 2.4 10*3/uL (ref 0.7–4.0)
MCHC: 33.2 g/dL (ref 30.0–36.0)
MCV: 80.1 fl (ref 78.0–100.0)
Monocytes Absolute: 0.6 10*3/uL (ref 0.1–1.0)
Monocytes Relative: 9.2 % (ref 3.0–12.0)
Neutro Abs: 3.6 10*3/uL (ref 1.4–7.7)
Neutrophils Relative %: 53.1 % (ref 43.0–77.0)
Platelets: 249 10*3/uL (ref 150.0–400.0)
RBC: 4.59 Mil/uL (ref 3.87–5.11)
RDW: 13.8 % (ref 11.5–15.5)
WBC: 6.8 10*3/uL (ref 4.0–10.5)

## 2020-03-29 LAB — IBC PANEL
Iron: 71 ug/dL (ref 42–145)
Saturation Ratios: 16.6 % — ABNORMAL LOW (ref 20.0–50.0)
Transferrin: 306 mg/dL (ref 212.0–360.0)

## 2020-03-29 LAB — TSH: TSH: 1.39 u[IU]/mL (ref 0.35–4.50)

## 2020-03-29 LAB — MICROALBUMIN / CREATININE URINE RATIO
Creatinine,U: 123.1 mg/dL
Microalb Creat Ratio: 0.6 mg/g (ref 0.0–30.0)
Microalb, Ur: <0.7 mg/dL (ref 0.0–1.9)

## 2020-03-29 LAB — FERRITIN: Ferritin: 18.9 ng/mL (ref 10.0–291.0)

## 2020-03-29 LAB — VITAMIN D 25 HYDROXY (VIT D DEFICIENCY, FRACTURES): VITD: 59.38 ng/mL (ref 30.00–100.00)

## 2020-04-03 ENCOUNTER — Encounter: Payer: Self-pay | Admitting: Family Medicine

## 2020-04-03 ENCOUNTER — Ambulatory Visit (INDEPENDENT_AMBULATORY_CARE_PROVIDER_SITE_OTHER): Payer: Federal, State, Local not specified - PPO | Admitting: Family Medicine

## 2020-04-03 ENCOUNTER — Other Ambulatory Visit: Payer: Self-pay

## 2020-04-03 VITALS — BP 118/78 | HR 98 | Temp 97.6°F | Ht <= 58 in | Wt 160.2 lb

## 2020-04-03 DIAGNOSIS — Z0000 Encounter for general adult medical examination without abnormal findings: Secondary | ICD-10-CM | POA: Diagnosis not present

## 2020-04-03 DIAGNOSIS — I10 Essential (primary) hypertension: Secondary | ICD-10-CM

## 2020-04-03 DIAGNOSIS — Z1211 Encounter for screening for malignant neoplasm of colon: Secondary | ICD-10-CM

## 2020-04-03 DIAGNOSIS — Z531 Procedure and treatment not carried out because of patient's decision for reasons of belief and group pressure: Secondary | ICD-10-CM

## 2020-04-03 DIAGNOSIS — E042 Nontoxic multinodular goiter: Secondary | ICD-10-CM

## 2020-04-03 DIAGNOSIS — E669 Obesity, unspecified: Secondary | ICD-10-CM

## 2020-04-03 DIAGNOSIS — E559 Vitamin D deficiency, unspecified: Secondary | ICD-10-CM | POA: Diagnosis not present

## 2020-04-03 NOTE — Assessment & Plan Note (Signed)
Continue to encourage healthy diet and lifestyle changes to affect sustainable weight loss.  

## 2020-04-03 NOTE — Assessment & Plan Note (Signed)
Saw Solum x1 03/2018. Overall benign eval.

## 2020-04-03 NOTE — Progress Notes (Signed)
This visit was conducted in person.  BP 118/78 (BP Location: Left Arm, Patient Position: Sitting, Cuff Size: Normal)   Pulse 98   Temp 97.6 F (36.4 C) (Temporal)   Ht 4\' 10"  (1.473 m)   Wt 160 lb 4 oz (72.7 kg)   LMP 01/29/2020   SpO2 100%   BMI 33.49 kg/m    CC: CPE Subjective:    Patient ID: Jodi Young, female    DOB: 1971-05-28, 49 y.o.   MRN: [id number]  HPI: Jodi Young is a 49 y.o. female presenting on 04/03/2020 for Annual Exam   HTN - compliant with amlodipine 10mg  + maxzide 37.5/25mg  daily with good BP control, without hypotensive symptoms. Notes lateral ankle swelling bilaterally from amlodipine, notes nocturia x1-2.   Occasional missed cycles. LMP 01/29/2020.   Preventative: Colon cancer screening - discussed, would like colonoscopy  Well womanwith OBGYNDr. Cousins yearly, last seen 01/2019 Mammogram - 01/2020 at [facility]- normal Off climara patch previously used for menstrual migraines - also uses aspirin and excedrin. No recent migraines.  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 Interim medical history since our last visit reviewed. Allergies and medications reviewed and updated. Outpatient Medications Prior to Visit  Medication Sig Dispense Refill  . amLODipine (NORVASC) 10 MG tablet TAKE 1 TABLET BY MOUTH EVERY DAY 90 tablet 0  . Cholecalciferol  (VITAMIN D3) 25 MCG (1000 UT) CAPS Take 1 capsule (1,000 Units total) by mouth daily. 30 capsule   . norethindrone (CAMILA) 0.35 MG tablet Take 1 tablet by mouth daily.    Marland Kitchen triamterene-hydrochlorothiazide (MAXZIDE-25) 37.5-25 MG tablet TAKE 1 TABLET BY MOUTH EVERY DAY 90 tablet 0  . estradiol (CLIMARA - DOSED IN MG/24 HR) 0.1 mg/24hr patch Place 0.1 mg onto the skin every 30 (thirty) days. Apply on day 22 of cycle for menstrual migraines as needed    . Magnesium 250 MG TABS Take 1 tablet by mouth daily.     No facility-administered medications prior to visit.     Per HPI unless specifically indicated in ROS section below Review of Systems  Constitutional: Negative for activity change, appetite change, chills, fatigue, fever and unexpected weight change.  HENT: Negative for hearing loss.   Eyes: Negative for visual disturbance.  Respiratory: Negative for cough, chest tightness, shortness of breath and wheezing.   Cardiovascular: Positive for leg swelling (ankles). Negative for chest pain and palpitations.  Gastrointestinal: Negative for abdominal distention, abdominal pain, blood in stool, constipation, diarrhea, nausea and vomiting.  Genitourinary: Negative for difficulty urinating and hematuria.  Musculoskeletal: Negative for arthralgias, myalgias and neck pain.  Skin: Negative for rash.  Neurological: Negative for dizziness, seizures, syncope and headaches.  Hematological: Negative for adenopathy. Does not bruise/bleed easily.  Psychiatric/Behavioral: Negative for dysphoric mood. The patient is not nervous/anxious.    Objective:  BP 118/78 (BP Location: Left Arm, Patient Position: Sitting, Cuff Size: Normal)  Pulse 98   Temp 97.6 F (36.4 C) (Temporal)   Ht 4\' 10"  (1.473 m)   Wt 160 lb 4 oz (72.7 kg)   LMP 01/29/2020   SpO2 100%   BMI 33.49 kg/m   Wt Readings from Last 3 Encounters:  04/03/20 160 lb 4 oz (72.7 kg)  12/01/19 165 lb 14.4 oz (75.3 kg)  08/31/19 164 lb 5 oz  (74.5 kg)    Ht Readings from Last 3 Encounters:  04/03/20 4\' 10"  (1.473 m)  08/31/19 4\' 11"  (1.499 m)  03/29/19 4\' 11"  (1.499 m)     Physical Exam Vitals and nursing note reviewed.  Constitutional:      General: She is not in acute distress.    Appearance: Normal appearance. She is well-developed. She is not ill-appearing.  HENT:     Head: Normocephalic and atraumatic.     Right Ear: Hearing, tympanic membrane, ear canal and external ear normal.     Left Ear: Hearing, tympanic membrane, ear canal and external ear normal.     Mouth/Throat:     Pharynx: Uvula midline.  Eyes:     General: No scleral icterus.    Extraocular Movements: Extraocular movements intact.     Conjunctiva/sclera: Conjunctivae normal.     Pupils: Pupils are equal, round, and reactive to light.  Cardiovascular:     Rate and Rhythm: Normal rate and regular rhythm.     Pulses: Normal pulses.          Radial pulses are 2+ on the right side and 2+ on the left side.     Heart sounds: Normal heart sounds. No murmur.  Pulmonary:     Effort: Pulmonary effort is normal. No respiratory distress.     Breath sounds: Normal breath sounds. No wheezing, rhonchi or rales.  Abdominal:     General: Abdomen is flat. Bowel sounds are normal. There is no distension.     Palpations: Abdomen is soft. There is no mass.     Tenderness: There is no abdominal tenderness. There is no guarding or rebound.     Hernia: No hernia is present.  Musculoskeletal:        General: Normal range of motion.     Cervical back: Normal range of motion and neck supple.  Lymphadenopathy:     Cervical: No cervical adenopathy.  Skin:    General: Skin is warm and dry.     Findings: No rash.  Neurological:     Mental Status: She is alert and oriented to person, place, and time.     Comments: CN grossly intact, station and gait intact  Psychiatric:        Behavior: Behavior normal.        Thought Content: Thought content normal.        Judgment:  Judgment normal.       Results for orders placed or performed in visit on 03/29/20  Microalbumin / creatinine urine ratio  Result Value Ref Range   Microalb, Ur <0.7 0.0 - 1.9 mg/dL   Creatinine,U mg/dL   Microalb Creat Ratio 0.6 0.0 - 30.0 mg/g  IBC panel  Result Value Ref Range   Iron 71 42 - 145 ug/dL   Transferrin 05/29/19 - 360.0 mg/dL   Saturation Ratios 05/29/20 (L) 20.0 - 50.0 %  Ferritin  Result Value Ref Range   Ferritin 18.9 10.0 - 291.0 ng/mL  CBC with Differential/Platelet  Result Value Ref Range   WBC 6.8 4.0 -  10.5 K/uL   RBC 4.59 3.87 - 5.11 Mil/uL   Hemoglobin 12.2 12.0 - 15.0 g/dL   HCT 72.5 36.6 - 44.0 %   MCV 80.1 78.0 - 100.0 fl   MCHC 33.2 30.0 - 36.0 g/dL   RDW 34.7 42.5 - 95.6 %   Platelets 249.0 150.0 - 400.0 K/uL   Neutrophils Relative % 53.1 43.0 - 77.0 %   Lymphocytes Relative 35.8 12.0 - 46.0 %   Monocytes Relative 9.2 3.0 - 12.0 %   Eosinophils Relative 1.4 0.0 - 5.0 %   Basophils Relative 0.5 0.0 - 3.0 %   Neutro Abs 3.6 1.4 - 7.7 K/uL   Lymphs Abs 2.4 0.7 - 4.0 K/uL   Monocytes Absolute 0.6 0.1 - 1.0 K/uL   Eosinophils Absolute 0.1 0.0 - 0.7 K/uL   Basophils Absolute 0.0 0.0 - 0.1 K/uL  Comprehensive metabolic panel  Result Value Ref Range   Sodium 135 135 - 145 mEq/L   Potassium 3.6 3.5 - 5.1 mEq/L   Chloride 99 96 - 112 mEq/L   CO2 30 19 - 32 mEq/L   Glucose, Bld 101 (H) 70 - 99 mg/dL   BUN 7 6 - 23 mg/dL   Creatinine, Ser 3.87 0.40 - 1.20 mg/dL   Total Bilirubin 0.5 0.2 - 1.2 mg/dL   Alkaline Phosphatase 64 39 - 117 U/L   AST 12 0 - 37 U/L   ALT 11 0 - 35 U/L   Total Protein 7.2 6.0 - 8.3 g/dL   Albumin 4.2 3.5 - 5.2 g/dL   GFR 56.43 >32.95 mL/min   Calcium 9.4 8.4 - 10.5 mg/dL  TSH  Result Value Ref Range   TSH 1.39 0.35 - 4.50 uIU/mL  VITAMIN D 25 Hydroxy (Vit-D Deficiency, Fractures)  Result Value Ref Range   VITD 59.38 30.00 - 100.00 ng/mL   Assessment & Plan:  This visit occurred during the SARS-CoV-2 public  health emergency.  Safety protocols were in place, including screening questions prior to the visit, additional usage of staff PPE, and extensive cleaning of exam room while observing appropriate contact time as indicated for disinfecting solutions.   Problem List Items Addressed This Visit    Vitamin D deficiency    Levels repleted with replacement.       Obesity, Class I, BMI 30-34.9    Continue to encourage healthy diet and lifestyle changes to affect sustainable weight loss.       No transfusions per religious beliefs   Multiple thyroid nodules    Saw [facility] x1 03/2018. Overall benign eval.      Hypertension    Chronic, stable continue current regimen.  She will let me know if ankle swelling becoming bothersome.       Healthcare maintenance - Primary    Preventative protocols reviewed and updated unless pt declined. Discussed healthy diet and lifestyle.        Other Visit Diagnoses    Special screening for malignant neoplasms, colon       Relevant Orders   Ambulatory referral to Gastroenterology       No orders of the defined types were placed in this encounter.  Orders Placed This Encounter  Procedures  . Ambulatory referral to Gastroenterology    Referral Priority:   Routine    Referral Type:   Consultation    Referral Reason:   Specialty Services Required    Number of Visits Requested:   1    Patient instructions: We will set  you up for screening cloonoscopy. Touch base with your insurance regarding coverage before 48 yo.  You are doing well today. Continue current medicines. Return as needed or in 1 year for next physical.   Follow up plan: Return in about 1 year (around 04/03/2021) for annual exam, prior fasting for blood work.  Eustaquio Boyden, MD

## 2020-04-03 NOTE — Patient Instructions (Addendum)
We will set you up for screening cloonoscopy. Touch base with your insurance regarding coverage before 48 yo.  You are doing well today. Continue current medicines. Return as needed or in 1 year for next physical.   Health Maintenance, Female Adopting a healthy lifestyle and getting preventive care are important in promoting health and wellness. Ask your health care provider about:  The right schedule for you to have regular tests and exams.  Things you can do on your own to prevent diseases and keep yourself healthy. What should I know about diet, weight, and exercise? Eat a healthy diet   Eat a diet that includes plenty of vegetables, fruits, low-fat dairy products, and lean protein.  Do not eat a lot of foods that are high in solid fats, added sugars, or sodium. Maintain a healthy weight Body mass index (BMI) is used to identify weight problems. It estimates body fat based on height and weight. Your health care provider can help determine your BMI and help you achieve or maintain a healthy weight. Get regular exercise Get regular exercise. This is one of the most important things you can do for your health. Most adults should:  Exercise for at least 150 minutes each week. The exercise should increase your heart rate and make you sweat (moderate-intensity exercise).  Do strengthening exercises at least twice a week. This is in addition to the moderate-intensity exercise.  Spend less time sitting. Even light physical activity can be beneficial. Watch cholesterol and blood lipids Have your blood tested for lipids and cholesterol at 48 years of age, then have this test every 5 years. Have your cholesterol levels checked more often if:  Your lipid or cholesterol levels are high.  You are older than 48 years of age.  You are at high risk for heart disease. What should I know about cancer screening? Depending on your health history and family history, you may need to have cancer  screening at various ages. This may include screening for:  Breast cancer.  Cervical cancer.  Colorectal cancer.  Skin cancer.  Lung cancer. What should I know about heart disease, diabetes, and high blood pressure? Blood pressure and heart disease  High blood pressure causes heart disease and increases the risk of stroke. This is more likely to develop in people who have high blood pressure readings, are of African descent, or are overweight.  Have your blood pressure checked: ? Every 3-5 years if you are 35-8 years of age. ? Every year if you are 27 years old or older. Diabetes Have regular diabetes screenings. This checks your fasting blood sugar level. Have the screening done:  Once every three years after age 28 if you are at a normal weight and have a low risk for diabetes.  More often and at a younger age if you are overweight or have a high risk for diabetes. What should I know about preventing infection? Hepatitis B If you have a higher risk for hepatitis B, you should be screened for this virus. Talk with your health care provider to find out if you are at risk for hepatitis B infection. Hepatitis C Testing is recommended for:  Everyone born from [birth year] through 1964.  Anyone with known risk factors for hepatitis C. Sexually transmitted infections (STIs)  Get screened for STIs, including gonorrhea and chlamydia, if: ? You are sexually active and are younger than 48 years of age. ? You are older than 48 years of age and your health care provider  tells you that you are at risk for this type of infection. ? Your sexual activity has changed since you were last screened, and you are at increased risk for chlamydia or gonorrhea. Ask your health care provider if you are at risk.  Ask your health care provider about whether you are at high risk for HIV. Your health care provider may recommend a prescription medicine to help prevent HIV infection. If you choose to take  medicine to prevent HIV, you should first get tested for HIV. You should then be tested every 3 months for as long as you are taking the medicine. Pregnancy  If you are about to stop having your period (premenopausal) and you may become pregnant, seek counseling before you get pregnant.  Take 400 to 800 micrograms (mcg) of folic acid every day if you become pregnant.  Ask for birth control (contraception) if you want to prevent pregnancy. Osteoporosis and menopause Osteoporosis is a disease in which the bones lose minerals and strength with aging. This can result in bone fractures. If you are 5 years old or older, or if you are at risk for osteoporosis and fractures, ask your health care provider if you should:  Be screened for bone loss.  Take a calcium or vitamin D supplement to lower your risk of fractures.  Be given hormone replacement therapy (HRT) to treat symptoms of menopause. Follow these instructions at home: Lifestyle  Do not use any products that contain nicotine or tobacco, such as cigarettes, e-cigarettes, and chewing tobacco. If you need help quitting, ask your health care provider.  Do not use street drugs.  Do not share needles.  Ask your health care provider for help if you need support or information about quitting drugs. Alcohol use  Do not drink alcohol if: ? Your health care provider tells you not to drink. ? You are pregnant, may be pregnant, or are planning to become pregnant.  If you drink alcohol: ? Limit how much you use to 0-1 drink a day. ? Limit intake if you are breastfeeding.  Be aware of how much alcohol is in your drink. In the [place] one drink equals one 12 oz bottle of beer (355 mL), one 5 oz glass of wine (148 mL), or one 1 oz glass of hard liquor (44 mL). General instructions  Schedule regular health, dental, and eye exams.  Stay current with your vaccines.  Tell your health care provider if: ? You often feel depressed. ? You have  ever been abused or do not feel safe at home. Summary  Adopting a healthy lifestyle and getting preventive care are important in promoting health and wellness.  Follow your health care provider's instructions about healthy diet, exercising, and getting tested or screened for diseases.  Follow your health care provider's instructions on monitoring your cholesterol and blood pressure. This information is not intended to replace advice given to you by your health care provider. Make sure you discuss any questions you have with your health care provider. Document Revised: 11/04/2018 Document Reviewed: 11/04/2018 Elsevier Patient Education  2020 [employer].

## 2020-04-03 NOTE — Assessment & Plan Note (Addendum)
Chronic, stable continue current regimen.  She will let me know if ankle swelling becoming bothersome.

## 2020-04-03 NOTE — Assessment & Plan Note (Signed)
Levels repleted with replacement.  

## 2020-04-03 NOTE — Assessment & Plan Note (Signed)
Preventative protocols reviewed and updated unless pt declined. Discussed healthy diet and lifestyle.  

## 2020-05-08 ENCOUNTER — Encounter: Payer: Self-pay | Admitting: Gastroenterology

## 2020-05-23 ENCOUNTER — Other Ambulatory Visit: Payer: Self-pay | Admitting: Family Medicine

## 2020-06-13 ENCOUNTER — Ambulatory Visit (AMBULATORY_SURGERY_CENTER): Payer: Self-pay | Admitting: *Deleted

## 2020-06-13 ENCOUNTER — Other Ambulatory Visit: Payer: Self-pay

## 2020-06-13 VITALS — Ht <= 58 in | Wt 160.0 lb

## 2020-06-13 DIAGNOSIS — Z1211 Encounter for screening for malignant neoplasm of colon: Secondary | ICD-10-CM

## 2020-06-13 MED ORDER — SUTAB 1479-225-188 MG PO TABS: 1.0000 | ORAL_TABLET | Freq: Once | ORAL | 0 refills | Status: AC

## 2020-06-13 NOTE — Progress Notes (Signed)
No egg or soy allergy known to patient  No issues with past sedation with any surgeries or procedures no intubation problems in the past  No diet pills per patient No home 02 use per patient  No blood thinners per patient  Pt denies issues with constipation  No A fib or A flutter  EMMI video to pt or MyChart  COVID 19 guidelines implemented in [facility] today  Coupon given to pt in [facility] today   Due to the COVID-19 pandemic we are asking patients to follow these guidelines. Please only bring one care partner. Please be aware that your care partner may wait in the car in the parking lot or if they feel like they will be too hot to wait in the car, they may wait in the lobby on the 4th floor. All care partners are required to wear a mask the entire time (we do not have any that we can provide them), they need to practice social distancing, and we will do a Covid check for all patient's and care partners when you arrive. Also we will check their temperature and your temperature. If the care partner waits in their car they need to stay in the parking lot the entire time and we will call them on their cell phone when the patient is ready for discharge so they can bring the car to the front of the building. Also all patient's will need to wear a mask into building. 

## 2020-06-15 ENCOUNTER — Other Ambulatory Visit: Payer: Self-pay | Admitting: Family Medicine

## 2020-06-25 HISTORY — PX: COLONOSCOPY: SHX174

## 2020-06-30 ENCOUNTER — Ambulatory Visit (AMBULATORY_SURGERY_CENTER): Payer: Federal, State, Local not specified - PPO | Admitting: Gastroenterology

## 2020-06-30 ENCOUNTER — Other Ambulatory Visit: Payer: Self-pay

## 2020-06-30 ENCOUNTER — Encounter: Payer: Self-pay | Admitting: Gastroenterology

## 2020-06-30 VITALS — BP 127/94 | HR 80 | Temp 96.9°F | Resp 20 | Ht <= 58 in | Wt 160.0 lb

## 2020-06-30 DIAGNOSIS — Z1211 Encounter for screening for malignant neoplasm of colon: Secondary | ICD-10-CM | POA: Diagnosis not present

## 2020-06-30 DIAGNOSIS — Z800 Family history of malignant neoplasm of digestive organs: Secondary | ICD-10-CM | POA: Diagnosis not present

## 2020-06-30 MED ORDER — SODIUM CHLORIDE 0.9 % IV SOLN: 500.0000 mL | Freq: Once | INTRAVENOUS | Status: DC

## 2020-06-30 NOTE — Patient Instructions (Signed)
Impression/Recommendations:  Resume previous diet. Continue present medications.  Repeat colonoscopy in 5 years for screening purposes.  YOU HAD AN ENDOSCOPIC PROCEDURE TODAY AT THE [facility]:   Refer to the procedure report that was given to you for any specific questions about what was found during the examination.  If the procedure report does not answer your questions, please call your gastroenterologist to clarify.  If you requested that your care partner not be given the details of your procedure findings, then the procedure report has been included in a sealed envelope for you to review at your convenience later.  YOU SHOULD EXPECT: Some feelings of bloating in the abdomen. Passage of more gas than usual.  Walking can help get rid of the air that was put into your GI tract during the procedure and reduce the bloating. If you had a lower endoscopy (such as a colonoscopy or flexible sigmoidoscopy) you may notice spotting of blood in your stool or on the toilet paper. If you underwent a bowel prep for your procedure, you may not have a normal bowel movement for a few days.  Please Note:  You might notice some irritation and congestion in your nose or some drainage.  This is from the oxygen used during your procedure.  There is no need for concern and it should clear up in a day or so.  SYMPTOMS TO REPORT IMMEDIATELY:  Following lower endoscopy (colonoscopy or flexible sigmoidoscopy):  Excessive amounts of blood in the stool  Significant tenderness or worsening of abdominal pains  Swelling of the abdomen that is new, acute  Fever of 100F or higher   For urgent or emergent issues, a gastroenterologist can be reached at any hour by calling [phone]. Do not use MyChart messaging for urgent concerns.    DIET:  We do recommend a small meal at first, but then you may proceed to your regular diet.  Drink plenty of fluids but you should avoid alcoholic beverages for 24  hours.  ACTIVITY:  You should plan to take it easy for the rest of today and you should NOT DRIVE or use heavy machinery until tomorrow (because of the sedation medicines used during the test).    FOLLOW UP: Our staff will call the number listed on your records 48-72 hours following your procedure to check on you and address any questions or concerns that you may have regarding the information given to you following your procedure. If we do not reach you, we will leave a message.  We will attempt to reach you two times.  During this call, we will ask if you have developed any symptoms of COVID 19. If you develop any symptoms (ie: fever, flu-like symptoms, shortness of breath, cough etc.) before then, please call [phone].  If you test positive for Covid 19 in the 2 weeks post procedure, please call and report this information to us.    If any biopsies were taken you will be contacted by phone or by letter within the next 1-3 weeks.  Please call us at [phone] if you have not heard about the biopsies in 3 weeks.    SIGNATURES/CONFIDENTIALITY: You and/or your care partner have signed paperwork which will be entered into your electronic medical record.  These signatures attest to the fact that that the information above on your After Visit Summary has been reviewed and is understood.  Full responsibility of the confidentiality of this discharge information lies with you and/or your care-partner.    

## 2020-06-30 NOTE — Progress Notes (Signed)
History reviewed with patient. Covid Vaccinated 02/23/20 VS-C.W

## 2020-06-30 NOTE — Progress Notes (Signed)
Report to PACU, RN, vss, BBS= Clear.  

## 2020-06-30 NOTE — Op Note (Signed)
[facility] Patient Name: Jodi Young Procedure Date: 06/30/2020 10:24 AM MRN: [id number] Endoscopist: Meryl Dare , MD Age: 49 Referring MD:  Date of Birth: 07-15-1971 Gender: Female Account #: [id number] Procedure:                Colonoscopy Indications:              Colon cancer screening in patient at increased                            risk: Family history of colorectal cancer in                            multiple 2nd degree relatives Medicines:                Monitored Anesthesia Care Procedure:                Pre-Anesthesia Assessment:                           - Prior to the procedure, a History and Physical                            was performed, and patient medications and                            allergies were reviewed. The patient's tolerance of                            previous anesthesia was also reviewed. The risks                            and benefits of the procedure and the sedation                            options and risks were discussed with the patient.                            All questions were answered, and informed consent                            was obtained. Prior Anticoagulants: The patient has                            taken no previous anticoagulant or antiplatelet                            agents. ASA Grade Assessment: II - A patient with                            mild systemic disease. After reviewing the risks                            and benefits, the patient was deemed in  satisfactory condition to undergo the procedure.                           After obtaining informed consent, the colonoscope                            was passed under direct vision. Throughout the                            procedure, the patient's blood pressure, pulse, and                            oxygen saturations were monitored continuously. The                            Colonoscope was introduced through  the anus and                            advanced to the the cecum, identified by                            appendiceal orifice and ileocecal valve. The                            ileocecal valve, appendiceal orifice, and rectum                            were photographed. The quality of the bowel                            preparation was excellent. The colonoscopy was                            performed without difficulty. The patient tolerated                            the procedure well. Scope In: 10:38:35 AM Scope Out: 10:50:33 AM Scope Withdrawal Time: 0 hours 8 minutes 30 seconds  Total Procedure Duration: 0 hours 11 minutes 58 seconds  Findings:                 The perianal and digital rectal examinations were                            normal.                           The entire examined colon appeared normal on direct                            and retroflexion views. Complications:            No immediate complications. Estimated blood loss:                            None. Estimated Blood Loss:  Estimated blood loss: none. Impression:               - The entire examined colon is normal on direct and                            retroflexion views.                           - No specimens collected. Recommendation:           - Repeat colonoscopy in 5 years for screening                            purposes.                           - Patient has a contact number available for                            emergencies. The signs and symptoms of potential                            delayed complications were discussed with the                            patient. Return to normal activities tomorrow.                            Written discharge instructions were provided to the                            patient.                           - Resume previous diet.                           - Continue present medications. Meryl Dare, MD 06/30/2020 10:52:38 AM This report  has been signed electronically.

## 2020-07-04 ENCOUNTER — Telehealth: Payer: Self-pay | Admitting: *Deleted

## 2020-07-04 NOTE — Telephone Encounter (Signed)
  Follow up Call-  Call back number 06/30/2020  Post procedure Call Back phone  # [phone]  Permission to leave phone message Yes  Some recent data might be hidden     Patient questions:  Do you have a fever, pain , or abdominal swelling? No. Pain Score  0 *  Have you tolerated food without any problems? Yes.    Have you been able to return to your normal activities? Yes.    Do you have any questions about your discharge instructions: Diet   No. Medications  No. Follow up visit  No.  Do you have questions or concerns about your Care? No.  Actions: * If pain score is 4 or above: No action needed, pain <4.  1. Have you developed a fever since your procedure? no  2.   Have you had an respiratory symptoms (SOB or cough) since your procedure? no  3.   Have you tested positive for COVID 19 since your procedure no  4.   Have you had any family members/close contacts diagnosed with the COVID 19 since your procedure?  no   If yes to any of these questions please route to Laverna Peace, RN and Charlett Lango, RN

## 2020-09-28 ENCOUNTER — Other Ambulatory Visit: Payer: Self-pay

## 2020-09-28 ENCOUNTER — Ambulatory Visit (INDEPENDENT_AMBULATORY_CARE_PROVIDER_SITE_OTHER): Payer: Federal, State, Local not specified - PPO

## 2020-09-28 DIAGNOSIS — Z23 Encounter for immunization: Secondary | ICD-10-CM

## 2021-05-02 DIAGNOSIS — Z1231 Encounter for screening mammogram for malignant neoplasm of breast: Secondary | ICD-10-CM | POA: Diagnosis not present

## 2021-05-02 LAB — HM MAMMOGRAPHY

## 2021-05-03 DIAGNOSIS — Z309 Encounter for contraceptive management, unspecified: Secondary | ICD-10-CM | POA: Diagnosis not present

## 2021-05-03 DIAGNOSIS — I10 Essential (primary) hypertension: Secondary | ICD-10-CM | POA: Diagnosis not present

## 2021-05-03 DIAGNOSIS — Z01419 Encounter for gynecological examination (general) (routine) without abnormal findings: Secondary | ICD-10-CM | POA: Diagnosis not present

## 2021-05-03 DIAGNOSIS — N644 Mastodynia: Secondary | ICD-10-CM | POA: Diagnosis not present

## 2021-05-05 ENCOUNTER — Encounter: Payer: Self-pay | Admitting: Family Medicine

## 2021-05-05 ENCOUNTER — Other Ambulatory Visit: Payer: Self-pay | Admitting: Family Medicine

## 2021-05-05 DIAGNOSIS — E559 Vitamin D deficiency, unspecified: Secondary | ICD-10-CM

## 2021-05-05 DIAGNOSIS — E042 Nontoxic multinodular goiter: Secondary | ICD-10-CM

## 2021-05-05 DIAGNOSIS — I10 Essential (primary) hypertension: Secondary | ICD-10-CM

## 2021-05-05 DIAGNOSIS — D509 Iron deficiency anemia, unspecified: Secondary | ICD-10-CM

## 2021-05-08 ENCOUNTER — Other Ambulatory Visit: Payer: Self-pay

## 2021-05-08 ENCOUNTER — Other Ambulatory Visit (INDEPENDENT_AMBULATORY_CARE_PROVIDER_SITE_OTHER): Payer: Federal, State, Local not specified - PPO

## 2021-05-08 DIAGNOSIS — E042 Nontoxic multinodular goiter: Secondary | ICD-10-CM

## 2021-05-08 DIAGNOSIS — I10 Essential (primary) hypertension: Secondary | ICD-10-CM

## 2021-05-08 DIAGNOSIS — E559 Vitamin D deficiency, unspecified: Secondary | ICD-10-CM | POA: Diagnosis not present

## 2021-05-08 DIAGNOSIS — D509 Iron deficiency anemia, unspecified: Secondary | ICD-10-CM | POA: Diagnosis not present

## 2021-05-08 LAB — BASIC METABOLIC PANEL
BUN: 11 mg/dL (ref 6–23)
CO2: 31 mEq/L (ref 19–32)
Calcium: 9.9 mg/dL (ref 8.4–10.5)
Chloride: 101 mEq/L (ref 96–112)
Creatinine, Ser: 1.06 mg/dL (ref 0.40–1.20)
GFR: 61.74 mL/min (ref >60.00)
Glucose, Bld: 92 mg/dL (ref 70–99)
Potassium: 4.1 mEq/L (ref 3.5–5.1)
Sodium: 139 mEq/L (ref 135–145)

## 2021-05-08 LAB — CBC WITH DIFFERENTIAL/PLATELET
Basophils Absolute: 0 10*3/uL (ref 0.0–0.1)
Basophils Relative: 0.2 % (ref 0.0–3.0)
Eosinophils Absolute: 0.1 10*3/uL (ref 0.0–0.7)
Eosinophils Relative: 1.7 % (ref 0.0–5.0)
HCT: 38.1 % (ref 36.0–46.0)
Hemoglobin: 12.5 g/dL (ref 12.0–15.0)
Lymphocytes Relative: 42.6 % (ref 12.0–46.0)
Lymphs Abs: 2.5 10*3/uL (ref 0.7–4.0)
MCHC: 32.7 g/dL (ref 30.0–36.0)
MCV: 79.6 fl (ref 78.0–100.0)
Monocytes Absolute: 0.5 10*3/uL (ref 0.1–1.0)
Monocytes Relative: 9.5 % (ref 3.0–12.0)
Neutro Abs: 2.6 10*3/uL (ref 1.4–7.7)
Neutrophils Relative %: 46 % (ref 43.0–77.0)
Platelets: 228 10*3/uL (ref 150.0–400.0)
RBC: 4.79 Mil/uL (ref 3.87–5.11)
RDW: 13.7 % (ref 11.5–15.5)
WBC: 5.8 10*3/uL (ref 4.0–10.5)

## 2021-05-08 LAB — IBC PANEL
Iron: 47 ug/dL (ref 42–145)
Saturation Ratios: 10.7 % — ABNORMAL LOW (ref 20.0–50.0)
Transferrin: 313 mg/dL (ref 212.0–360.0)

## 2021-05-08 LAB — VITAMIN D 25 HYDROXY (VIT D DEFICIENCY, FRACTURES): VITD: 29.79 ng/mL — ABNORMAL LOW (ref 30.00–100.00)

## 2021-05-08 LAB — MICROALBUMIN / CREATININE URINE RATIO
Creatinine,U: 168.1 mg/dL
Microalb Creat Ratio: 0.4 mg/g (ref 0.0–30.0)
Microalb, Ur: <0.7 mg/dL (ref 0.0–1.9)

## 2021-05-08 LAB — TSH: TSH: 1.81 u[IU]/mL (ref 0.35–4.50)

## 2021-05-08 LAB — FERRITIN: Ferritin: 21.1 ng/mL (ref 10.0–291.0)

## 2021-05-15 ENCOUNTER — Encounter: Payer: Self-pay | Admitting: Family Medicine

## 2021-05-15 ENCOUNTER — Other Ambulatory Visit: Payer: Self-pay

## 2021-05-15 ENCOUNTER — Ambulatory Visit (INDEPENDENT_AMBULATORY_CARE_PROVIDER_SITE_OTHER): Payer: Federal, State, Local not specified - PPO | Admitting: Family Medicine

## 2021-05-15 VITALS — BP 122/86 | HR 86 | Temp 98.0°F | Ht <= 58 in | Wt 155.5 lb

## 2021-05-15 DIAGNOSIS — D509 Iron deficiency anemia, unspecified: Secondary | ICD-10-CM | POA: Diagnosis not present

## 2021-05-15 DIAGNOSIS — E66811 Obesity, class 1: Secondary | ICD-10-CM

## 2021-05-15 DIAGNOSIS — G43829 Menstrual migraine, not intractable, without status migrainosus: Secondary | ICD-10-CM

## 2021-05-15 DIAGNOSIS — E559 Vitamin D deficiency, unspecified: Secondary | ICD-10-CM

## 2021-05-15 DIAGNOSIS — E041 Nontoxic single thyroid nodule: Secondary | ICD-10-CM | POA: Diagnosis not present

## 2021-05-15 DIAGNOSIS — Z0000 Encounter for general adult medical examination without abnormal findings: Secondary | ICD-10-CM

## 2021-05-15 DIAGNOSIS — E669 Obesity, unspecified: Secondary | ICD-10-CM

## 2021-05-15 DIAGNOSIS — Z531 Procedure and treatment not carried out because of patient's decision for reasons of belief and group pressure: Secondary | ICD-10-CM

## 2021-05-15 DIAGNOSIS — I10 Essential (primary) hypertension: Secondary | ICD-10-CM

## 2021-05-15 MED ORDER — AMLODIPINE BESYLATE 10 MG PO TABS: 1.0000 | ORAL_TABLET | Freq: Every day | ORAL | 3 refills | Status: DC

## 2021-05-15 MED ORDER — LOSARTAN POTASSIUM 50 MG PO TABS: 50.0000 mg | ORAL_TABLET | Freq: Every day | ORAL | 3 refills | Status: DC

## 2021-05-15 NOTE — Assessment & Plan Note (Addendum)
Encouraged healthy diet and lifestyle changes to affect sustainable weight loss.  

## 2021-05-15 NOTE — Assessment & Plan Note (Signed)
Had been off this - advised restart replacement.

## 2021-05-15 NOTE — Assessment & Plan Note (Signed)
Preventative protocols reviewed and updated unless pt declined. Discussed healthy diet and lifestyle.  

## 2021-05-15 NOTE — Assessment & Plan Note (Addendum)
Anemia has resolved, iron deficiency remains. Anticipate due to combination of menstrual blood loss and insufficient dietary intake.  Discussed iron rich diet. Will continue to monitor this.

## 2021-05-15 NOTE — Assessment & Plan Note (Signed)
Benign. No significant nodularity on exam today. Will continue to monitor.

## 2021-05-15 NOTE — Assessment & Plan Note (Signed)
Actually significantly improved over the past year - attributes to drinking carbonated mineral water.

## 2021-05-15 NOTE — Progress Notes (Signed)
Patient ID: Jodi Young, female    DOB: [birth year], 50 y.o.   MRN: [id number]  This visit was conducted in person.  BP 122/86   Pulse 86   Temp 98 F (36.7 C) (Temporal)   Ht 4' 9.5" (1.461 m)   Wt 155 lb 8 oz (70.5 kg)   LMP 04/22/2021   SpO2 99%   BMI 33.07 kg/m    CC: CPE Subjective:   HPI: Jodi Young is a 50 y.o. female presenting on 05/15/2021 for Annual Exam (Needs BP monitor request form completed. 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 Notes lateral ankle swelling bilaterally from amlodipine, notes nocturia x1-2.   No recent menstrual migraines. Previously on climara patch. Notes improvement in migraines since she started drinking carbonated mineral water.  Now having regular periods. LMP 04/22/2021  Did test positive for COVID 03/2021 - mild symptoms.   Planning to try mediterranean diet.     Preventative: Colonoscopy 06/2020 WNL, rpt 5 yrs given fmhx 07/2020) Well woman with OBGYN Dr. Russella Dar yearly, last seen 04/2021 -  Mammogram - 04/2021 at OBGYN - normal - will request records  Now having regular periods. 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 Interim medical history since our last visit  reviewed. Allergies and medications reviewed and updated. Outpatient Medications Prior to Visit  Medication Sig Dispense Refill   amLODipine (NORVASC) 10 MG tablet TAKE 1 TABLET BY MOUTH EVERY DAY 90 tablet 3   triamterene-hydrochlorothiazide (MAXZIDE-25) 37.5-25 MG tablet TAKE 1 TABLET BY MOUTH EVERY DAY 90 tablet 3   Cholecalciferol (VITAMIN D3) 25 MCG (1000 UT) CAPS Take 1 capsule (1,000 Units total) by mouth daily. (Patient not taking: No sig reported) 30 capsule    norethindrone (MICRONOR) 0.35 MG tablet Take 1 tablet by mouth daily. (Patient not taking: Reported on 05/15/2021)     No facility-administered medications prior to visit.     Per HPI unless specifically indicated in ROS section below Review of Systems  Constitutional:  Negative for activity change, appetite change, chills, fatigue, fever and unexpected weight change.  HENT:  Negative for hearing loss.   Eyes:  Negative for visual disturbance.  Respiratory:  Negative for cough, chest tightness, shortness of breath and wheezing.   Cardiovascular:  Negative for chest pain, palpitations and leg swelling.  Gastrointestinal:  Negative for abdominal distention, abdominal pain, blood in stool, constipation, diarrhea, nausea and vomiting.  Genitourinary:  Negative for difficulty urinating and hematuria.  Musculoskeletal:  Negative for arthralgias, myalgias and neck pain.  Skin:  Negative for rash.  Neurological:  Negative for dizziness, seizures, syncope and headaches.  Hematological:  Negative for adenopathy. Does not bruise/bleed easily.  Psychiatric/Behavioral:  Negative for dysphoric mood. The patient is  not nervous/anxious.   Objective:  BP 122/86   Pulse 86   Temp 98 F (36.7 C) (Temporal)   Ht 4' 9.5" (1.461 m)   Wt 155 lb 8 oz (70.5 kg)   LMP 04/22/2021   SpO2 99%   BMI 33.07 kg/m   Wt Readings from Last 3 Encounters:  05/15/21 155 lb 8 oz (70.5 kg)  06/30/20 160 lb (72.6 kg)  06/13/20 160 lb (72.6 kg)       Physical Exam Vitals and nursing note reviewed.  Constitutional:      Appearance: Normal appearance. She is not ill-appearing.  Eyes:     Extraocular Movements: Extraocular movements intact.     Conjunctiva/sclera: Conjunctivae normal.     Pupils: Pupils are equal, round, and reactive to light.  Cardiovascular:     Rate and Rhythm: Normal rate and regular rhythm.     Pulses: Normal pulses.     Heart sounds: Normal heart sounds. No murmur heard. Pulmonary:     Effort: Pulmonary effort is normal. No respiratory distress.     Breath sounds: Normal breath sounds. No wheezing or rhonchi.  Musculoskeletal:     Cervical back: Normal range of motion and neck supple. No rigidity.     Right lower leg: No edema.     Left lower leg: No edema.  Lymphadenopathy:     Cervical: No cervical adenopathy.  Skin:    General: Skin is warm and dry.     Findings: No rash.  Neurological:     Mental Status: She is alert.  Psychiatric:        Mood and Affect: Mood normal.        Behavior: Behavior normal.      Results for orders placed or performed in visit on 05/08/21  TSH  Result Value Ref Range   TSH 1.81 0.35 - 4.50 uIU/mL  CBC with Differential/Platelet  Result Value Ref Range   WBC 5.8 4.0 - 10.5 K/uL   RBC 4.79 3.87 - 5.11 Mil/uL   Hemoglobin 12.5 12.0 - 15.0 g/dL   HCT 16.1 09.6 - 04.5 %   MCV 79.6 78.0 - 100.0 fl   MCHC 32.7 30.0 - 36.0 g/dL   RDW 40.9 81.1 - 91.4 %   Platelets 228.0 150.0 - 400.0 K/uL   Neutrophils Relative % 46.0 43.0 - 77.0 %   Lymphocytes Relative 42.6 12.0 - 46.0 %   Monocytes Relative 9.5 3.0 - 12.0 %   Eosinophils Relative 1.7 0.0 - 5.0 %   Basophils Relative 0.2 0.0 - 3.0 %   Neutro Abs 2.6 1.4 - 7.7 K/uL   Lymphs Abs 2.5 0.7 - 4.0 K/uL   Monocytes Absolute 0.5 0.1 - 1.0 K/uL   Eosinophils Absolute 0.1 0.0 - 0.7 K/uL   Basophils Absolute 0.0 0.0 - 0.1 K/uL  VITAMIN D 25 Hydroxy (Vit-D Deficiency, Fractures)  Result Value Ref Range   VITD 29.79 (L)  30.00 - 100.00 ng/mL  Ferritin  Result Value Ref Range   Ferritin 21.1 10.0 - 291.0 ng/mL  IBC panel  Result Value Ref Range   Iron 47 42 - 145 ug/dL   Transferrin 782.9 562.1 - 360.0 mg/dL   Saturation Ratios 30.8 (L) 20.0 - 50.0 %  Microalbumin / creatinine urine ratio  Result Value Ref Range   Microalb, Ur <0.7 0.0 - 1.9 mg/dL   Creatinine,U 657.8 mg/dL   Microalb Creat Ratio 0.4 0.0 - 30.0 mg/g  Basic metabolic panel  Result Value  Ref Range   Sodium 139 135 - 145 mEq/L   Potassium 4.1 3.5 - 5.1 mEq/L   Chloride 101 96 - 112 mEq/L   CO2 31 19 - 32 mEq/L   Glucose, Bld 92 70 - 99 mg/dL   BUN 11 6 - 23 mg/dL   Creatinine, Ser 8.18 0.40 - 1.20 mg/dL   GFR 29.93 >71.69 mL/min   Calcium 9.9 8.4 - 10.5 mg/dL   Assessment & Plan:  This visit occurred during the SARS-CoV-2 public health emergency.  Safety protocols were in place, including screening questions prior to the visit, additional usage of staff PPE, and extensive cleaning of exam room while observing appropriate contact time as indicated for disinfecting solutions.   Problem List Items Addressed This Visit     Menstrual migraine    Actually significantly improved over the past year - attributes to drinking carbonated mineral water.        Relevant Medications   amLODipine (NORVASC) 10 MG tablet   losartan (COZAAR) 50 MG tablet   Thyroid nodule    Benign. No significant nodularity on exam today. Will continue to monitor.        Healthcare maintenance - Primary    Preventative protocols reviewed and updated unless pt declined. Discussed healthy diet and lifestyle.        Anemia, iron deficiency    Anemia has resolved, iron deficiency remains. Anticipate due to combination of menstrual blood loss and insufficient dietary intake.  Discussed iron rich diet. Will continue to monitor this.        Vitamin D deficiency    Had been off this - advised restart replacement.        Hypertension    Chronic, stable  on amlodipine and maxzide. 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  Will change maxzide to losartan 50mg  daily.  RTC 10 days labs to check Cr/K and RTC 1-2 months for HTN f/u visit. Update sooner if any trouble with new regimen.        Relevant Medications   amLODipine (NORVASC) 10 MG tablet   losartan (COZAAR) 50 MG tablet   Obesity, Class I, BMI 30-34.9    Encouraged healthy diet and lifestyle changes to affect sustainable weight loss.         No transfusions per religious beliefs     Meds ordered this encounter  Medications   amLODipine (NORVASC) 10 MG tablet    Sig: Take 1 tablet (10 mg total) by mouth daily.    Dispense:  90 tablet    Refill:  3   losartan (COZAAR) 50 MG tablet    Sig: Take 1 tablet (50 mg total) by mouth daily.    Dispense:  90 tablet    Refill:  3    No orders of the defined types were placed in this encounter.   Patient instructions: We will request records of latest mammogram at [facility].  Continue amlodipine. Stop maxzide. Start in its place losartan 50mg  daily.  Schedule lab visit in 10 days to check kidneys and potassium on new medicine. Schedule BP follow up visit in 1-2 months.   Follow up plan: Return in about 6 weeks (around 06/26/2021) for follow up visit.  , MD

## 2021-05-15 NOTE — Assessment & Plan Note (Signed)
Chronic, stable on amlodipine and maxzide. 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  Will change maxzide to losartan 50mg  daily.  RTC 10 days labs to check Cr/K and RTC 1-2 months for HTN f/u visit. Update sooner if any trouble with new regimen.

## 2021-05-15 NOTE — Patient Instructions (Addendum)
We will request records of latest mammogram at [facility].  Continue amlodipine. Stop maxzide. Start in its place losartan 50mg  daily.  Schedule lab visit in 10 days to check kidneys and potassium on new medicine. Schedule BP follow up visit in 1-2 months.   Health Maintenance, Female Adopting a healthy lifestyle and getting preventive care are important in promoting health and wellness. Ask your health care provider about: The right schedule for you to have regular tests and exams. Things you can do on your own to prevent diseases and keep yourself healthy. What should I know about diet, weight, and exercise? Eat a healthy diet  Eat a diet that includes plenty of vegetables, fruits, low-fat dairy products, and lean protein. Do not eat a lot of foods that are high in solid fats, added sugars, or sodium.  Maintain a healthy weight Body mass index (BMI) is used to identify weight problems. It estimates body fat based on height and weight. Your health care provider can help determineyour BMI and help you achieve or maintain a healthy weight. Get regular exercise Get regular exercise. This is one of the most important things you can do for your health. Most adults should: Exercise for at least 150 minutes each week. The exercise should increase your heart rate and make you sweat (moderate-intensity exercise). Do strengthening exercises at least twice a week. This is in addition to the moderate-intensity exercise. Spend less time sitting. Even light physical activity can be beneficial. Watch cholesterol and blood lipids Have your blood tested for lipids and cholesterol at 49 years of age, then havethis test every 5 years. Have your cholesterol levels checked more often if: Your lipid or cholesterol levels are high. You are older than 49 years of age. You are at high risk for heart disease. What should I know about cancer screening? Depending on your health history and family history, you  may need to have cancer screening at various ages. This may include screening for: Breast cancer. Cervical cancer. Colorectal cancer. Skin cancer. Lung cancer. What should I know about heart disease, diabetes, and high blood pressure? Blood pressure and heart disease High blood pressure causes heart disease and increases the risk of stroke. This is more likely to develop in people who have high blood pressure readings, are of African descent, or are overweight. Have your blood pressure checked: Every 3-5 years if you are 70-88 years of age. Every year if you are 67 years old or older. Diabetes Have regular diabetes screenings. This checks your fasting blood sugar level. Have the screening done: Once every three years after age 64 if you are at a normal weight and have a low risk for diabetes. More often and at a younger age if you are overweight or have a high risk for diabetes. What should I know about preventing infection? Hepatitis B If you have a higher risk for hepatitis B, you should be screened for this virus. Talk with your health care provider to find out if you are at risk forhepatitis B infection. Hepatitis C Testing is recommended for: Everyone born from [birth year] through 1964. Anyone with known risk factors for hepatitis C. Sexually transmitted infections (STIs) Get screened for STIs, including gonorrhea and chlamydia, if: You are sexually active and are younger than 49 years of age. You are older than 49 years of age and your health care provider tells you that you are at risk for this type of infection. Your sexual activity has changed since you were  last screened, and you are at increased risk for chlamydia or gonorrhea. Ask your health care provider if you are at risk. Ask your health care provider about whether you are at high risk for HIV. Your health care provider may recommend a prescription medicine to help prevent HIV infection. If you choose to take medicine to prevent  HIV, you should first get tested for HIV. You should then be tested every 3 months for as long as you are taking the medicine. Pregnancy If you are about to stop having your period (premenopausal) and you may become pregnant, seek counseling before you get pregnant. Take 400 to 800 micrograms (mcg) of folic acid every day if you become pregnant. Ask for birth control (contraception) if you want to prevent pregnancy. Osteoporosis and menopause Osteoporosis is a disease in which the bones lose minerals and strength with aging. This can result in bone fractures. If you are 54 years old or older, or if you are at risk for osteoporosis and fractures, ask your health care provider if you should: Be screened for bone loss. Take a calcium or vitamin D supplement to lower your risk of fractures. Be given hormone replacement therapy (HRT) to treat symptoms of menopause. Follow these instructions at home: Lifestyle Do not use any products that contain nicotine or tobacco, such as cigarettes, e-cigarettes, and chewing tobacco. If you need help quitting, ask your health care provider. Do not use street drugs. Do not share needles. Ask your health care provider for help if you need support or information about quitting drugs. Alcohol use Do not drink alcohol if: Your health care provider tells you not to drink. You are pregnant, may be pregnant, or are planning to become pregnant. If you drink alcohol: Limit how much you use to 0-1 drink a day. Limit intake if you are breastfeeding. Be aware of how much alcohol is in your drink. In the [place] one drink equals one 12 oz bottle of beer (355 mL), one 5 oz glass of wine (148 mL), or one 1 oz glass of hard liquor (44 mL). General instructions Schedule regular health, dental, and eye exams. Stay current with your vaccines. Tell your health care provider if: You often feel depressed. You have ever been abused or do not feel safe at home. Summary Adopting a  healthy lifestyle and getting preventive care are important in promoting health and wellness. Follow your health care provider's instructions about healthy diet, exercising, and getting tested or screened for diseases. Follow your health care provider's instructions on monitoring your cholesterol and blood pressure. This information is not intended to replace advice given to you by your health care provider. Make sure you discuss any questions you have with your healthcare provider. Document Revised: 11/04/2018 Document Reviewed: 11/04/2018 Elsevier Patient Education  2022 [employer].

## 2021-05-21 ENCOUNTER — Encounter: Payer: Self-pay | Admitting: Family Medicine

## 2021-07-09 DIAGNOSIS — N644 Mastodynia: Secondary | ICD-10-CM | POA: Diagnosis not present

## 2022-01-14 DIAGNOSIS — N6313 Unspecified lump in the right breast, lower outer quadrant: Secondary | ICD-10-CM | POA: Diagnosis not present

## 2022-01-14 DIAGNOSIS — R928 Other abnormal and inconclusive findings on diagnostic imaging of breast: Secondary | ICD-10-CM | POA: Diagnosis not present

## 2022-01-14 DIAGNOSIS — N644 Mastodynia: Secondary | ICD-10-CM | POA: Diagnosis not present

## 2022-05-05 ENCOUNTER — Other Ambulatory Visit: Payer: Self-pay | Admitting: Family Medicine

## 2022-05-07 DIAGNOSIS — Z309 Encounter for contraceptive management, unspecified: Secondary | ICD-10-CM | POA: Diagnosis not present

## 2022-05-07 DIAGNOSIS — Z01419 Encounter for gynecological examination (general) (routine) without abnormal findings: Secondary | ICD-10-CM | POA: Diagnosis not present

## 2022-05-07 DIAGNOSIS — I10 Essential (primary) hypertension: Secondary | ICD-10-CM | POA: Diagnosis not present

## 2022-05-13 ENCOUNTER — Telehealth: Payer: Self-pay

## 2022-05-13 NOTE — Telephone Encounter (Signed)
Patient is calling in wanting to schedule her shingles vaccine, okay to schedule.

## 2022-05-14 NOTE — Telephone Encounter (Signed)
LVM to call and schedule

## 2022-05-14 NOTE — Telephone Encounter (Signed)
Yes, pt can be scheduled for a NV for her 1st shingles shot.

## 2022-05-18 ENCOUNTER — Other Ambulatory Visit: Payer: Self-pay | Admitting: Family Medicine

## 2022-06-05 ENCOUNTER — Ambulatory Visit: Payer: Federal, State, Local not specified - PPO

## 2022-06-05 ENCOUNTER — Telehealth: Payer: Self-pay

## 2022-06-05 NOTE — Telephone Encounter (Signed)
Pt had nurse visit today for first shingrix. Pt has not checked with ins co for coverage of shingrix and how much the ins co pays. Pt rescheduled nurse visit for 06/12/22 so pt can speak with her ins co for coverage info on shingrix. Immunization form signed at [employer].

## 2022-06-12 ENCOUNTER — Ambulatory Visit (INDEPENDENT_AMBULATORY_CARE_PROVIDER_SITE_OTHER): Payer: Federal, State, Local not specified - PPO

## 2022-06-12 DIAGNOSIS — Z23 Encounter for immunization: Secondary | ICD-10-CM

## 2022-06-12 NOTE — Progress Notes (Signed)
Per orders of Dr. Sharen Hones, first injection of Shingrix given by Erby Pian. Patient tolerated injection well.

## 2022-07-01 DIAGNOSIS — R922 Inconclusive mammogram: Secondary | ICD-10-CM | POA: Diagnosis not present

## 2022-07-01 DIAGNOSIS — N6313 Unspecified lump in the right breast, lower outer quadrant: Secondary | ICD-10-CM | POA: Diagnosis not present

## 2022-07-01 LAB — HM MAMMOGRAPHY

## 2022-07-09 ENCOUNTER — Telehealth: Payer: Self-pay | Admitting: Family Medicine

## 2022-07-09 NOTE — Telephone Encounter (Signed)
Pt called in stated she wants to start taking Golo and need to know if its safe . Please Advise [phone]

## 2022-07-09 NOTE — Telephone Encounter (Signed)
Golo supplement contains: magnesium, zinc, chromium, rhodiola extract, inositol, berberine extract, gardenia extract, banaba extract, salaretin salacia extract, apple extract.   Can't recommend it for weight loss as it doesn't have strong evidence behind effectiveness for this. Unsure of safety of this herbal blend either.

## 2022-07-11 NOTE — Telephone Encounter (Signed)
Patient advised.

## 2022-07-29 ENCOUNTER — Other Ambulatory Visit: Payer: Self-pay | Admitting: Family Medicine

## 2022-08-10 ENCOUNTER — Other Ambulatory Visit: Payer: Self-pay | Admitting: Family Medicine

## 2022-08-10 DIAGNOSIS — Z1159 Encounter for screening for other viral diseases: Secondary | ICD-10-CM

## 2022-08-10 DIAGNOSIS — Z131 Encounter for screening for diabetes mellitus: Secondary | ICD-10-CM

## 2022-08-10 DIAGNOSIS — E559 Vitamin D deficiency, unspecified: Secondary | ICD-10-CM

## 2022-08-10 DIAGNOSIS — E041 Nontoxic single thyroid nodule: Secondary | ICD-10-CM

## 2022-08-10 DIAGNOSIS — Z13220 Encounter for screening for lipoid disorders: Secondary | ICD-10-CM

## 2022-08-10 DIAGNOSIS — D509 Iron deficiency anemia, unspecified: Secondary | ICD-10-CM

## 2022-08-14 ENCOUNTER — Other Ambulatory Visit (INDEPENDENT_AMBULATORY_CARE_PROVIDER_SITE_OTHER): Payer: Federal, State, Local not specified - PPO

## 2022-08-14 DIAGNOSIS — E559 Vitamin D deficiency, unspecified: Secondary | ICD-10-CM

## 2022-08-14 DIAGNOSIS — E041 Nontoxic single thyroid nodule: Secondary | ICD-10-CM | POA: Diagnosis not present

## 2022-08-14 DIAGNOSIS — Z1159 Encounter for screening for other viral diseases: Secondary | ICD-10-CM

## 2022-08-14 DIAGNOSIS — Z13220 Encounter for screening for lipoid disorders: Secondary | ICD-10-CM | POA: Diagnosis not present

## 2022-08-14 DIAGNOSIS — Z131 Encounter for screening for diabetes mellitus: Secondary | ICD-10-CM | POA: Diagnosis not present

## 2022-08-14 DIAGNOSIS — D509 Iron deficiency anemia, unspecified: Secondary | ICD-10-CM

## 2022-08-14 LAB — BASIC METABOLIC PANEL
BUN: 7 mg/dL (ref 6–23)
CO2: 26 mEq/L (ref 19–32)
Calcium: 9.6 mg/dL (ref 8.4–10.5)
Chloride: 102 mEq/L (ref 96–112)
Creatinine, Ser: 0.88 mg/dL (ref 0.40–1.20)
GFR: 76.5 mL/min (ref >60.00)
Glucose, Bld: 105 mg/dL — ABNORMAL HIGH (ref 70–99)
Potassium: 4.2 mEq/L (ref 3.5–5.1)
Sodium: 137 mEq/L (ref 135–145)

## 2022-08-14 LAB — CBC WITH DIFFERENTIAL/PLATELET
Basophils Absolute: 0 10*3/uL (ref 0.0–0.1)
Basophils Relative: 0.7 % (ref 0.0–3.0)
Eosinophils Absolute: 0.1 10*3/uL (ref 0.0–0.7)
Eosinophils Relative: 1.6 % (ref 0.0–5.0)
HCT: 38.2 % (ref 36.0–46.0)
Hemoglobin: 12.7 g/dL (ref 12.0–15.0)
Lymphocytes Relative: 41.2 % (ref 12.0–46.0)
Lymphs Abs: 1.9 10*3/uL (ref 0.7–4.0)
MCHC: 33.1 g/dL (ref 30.0–36.0)
MCV: 80.7 fl (ref 78.0–100.0)
Monocytes Absolute: 0.4 10*3/uL (ref 0.1–1.0)
Monocytes Relative: 8.3 % (ref 3.0–12.0)
Neutro Abs: 2.2 10*3/uL (ref 1.4–7.7)
Neutrophils Relative %: 48.2 % (ref 43.0–77.0)
Platelets: 214 10*3/uL (ref 150.0–400.0)
RBC: 4.74 Mil/uL (ref 3.87–5.11)
RDW: 13.3 % (ref 11.5–15.5)
WBC: 4.5 10*3/uL (ref 4.0–10.5)

## 2022-08-14 LAB — LIPID PANEL
Cholesterol: 150 mg/dL (ref 0–200)
HDL: 45.7 mg/dL (ref >39.00)
LDL Cholesterol: 93 mg/dL (ref 0–99)
NonHDL: 104.64
Total CHOL/HDL Ratio: 3
Triglycerides: 58 mg/dL (ref 0.0–149.0)
VLDL: 11.6 mg/dL (ref 0.0–40.0)

## 2022-08-14 LAB — TSH: TSH: 0.8 u[IU]/mL (ref 0.35–5.50)

## 2022-08-14 LAB — IBC PANEL
Iron: 91 ug/dL (ref 42–145)
Saturation Ratios: 23.7 % (ref 20.0–50.0)
TIBC: 383.6 ug/dL (ref 250.0–450.0)
Transferrin: 274 mg/dL (ref 212.0–360.0)

## 2022-08-14 LAB — FERRITIN: Ferritin: 23.2 ng/mL (ref 10.0–291.0)

## 2022-08-14 LAB — VITAMIN D 25 HYDROXY (VIT D DEFICIENCY, FRACTURES): VITD: 23.45 ng/mL — ABNORMAL LOW (ref 30.00–100.00)

## 2022-08-15 LAB — HEPATITIS C ANTIBODY: Hepatitis C Ab: NONREACTIVE

## 2022-08-19 ENCOUNTER — Ambulatory Visit (INDEPENDENT_AMBULATORY_CARE_PROVIDER_SITE_OTHER): Payer: Federal, State, Local not specified - PPO | Admitting: Family Medicine

## 2022-08-19 ENCOUNTER — Encounter: Payer: Self-pay | Admitting: Family Medicine

## 2022-08-19 ENCOUNTER — Other Ambulatory Visit: Payer: Self-pay | Admitting: Family Medicine

## 2022-08-19 VITALS — BP 124/76 | HR 91 | Temp 97.9°F | Ht <= 58 in | Wt 160.4 lb

## 2022-08-19 DIAGNOSIS — E559 Vitamin D deficiency, unspecified: Secondary | ICD-10-CM

## 2022-08-19 DIAGNOSIS — Z0000 Encounter for general adult medical examination without abnormal findings: Secondary | ICD-10-CM

## 2022-08-19 DIAGNOSIS — Z23 Encounter for immunization: Secondary | ICD-10-CM | POA: Diagnosis not present

## 2022-08-19 DIAGNOSIS — I10 Essential (primary) hypertension: Secondary | ICD-10-CM

## 2022-08-19 DIAGNOSIS — E669 Obesity, unspecified: Secondary | ICD-10-CM

## 2022-08-19 MED ORDER — CHOLECALCIFEROL 1.25 MG (50000 UT) PO CAPS: 50000.0000 [IU] | ORAL_CAPSULE | Freq: Every day | ORAL | 3 refills | Status: DC

## 2022-08-19 MED ORDER — LOSARTAN POTASSIUM 50 MG PO TABS: 50.0000 mg | ORAL_TABLET | Freq: Every day | ORAL | 3 refills | Status: DC

## 2022-08-19 MED ORDER — AMLODIPINE BESYLATE 10 MG PO TABS: 10.0000 mg | ORAL_TABLET | Freq: Every day | ORAL | 3 refills | Status: DC

## 2022-08-19 NOTE — Assessment & Plan Note (Signed)
Has not been taking vit D 1000 IU daily - will Rx 50k units weekly over the next year.

## 2022-08-19 NOTE — Assessment & Plan Note (Signed)
Preventative protocols reviewed and updated unless pt declined. Discussed healthy diet and lifestyle.  

## 2022-08-19 NOTE — Patient Instructions (Addendum)
Flu shot today Tdap today  We will request mammogram records from [place].  Check with [facility] on type and date of COVID booster and send [place] through [place].  Start vitamin D 50,000 units weekly sent to pharmacy.  Good to see you today  Return as needed or in 1 year for next physical.   Health Maintenance, Female Adopting a healthy lifestyle and getting preventive care are important in promoting health and wellness. Ask your health care provider about: The right schedule for you to have regular tests and exams. Things you can do on your own to prevent diseases and keep yourself healthy. What should I know about diet, weight, and exercise? Eat a healthy diet  Eat a diet that includes plenty of vegetables, fruits, low-fat dairy products, and lean protein. Do not eat a lot of foods that are high in solid fats, added sugars, or sodium. Maintain a healthy weight Body mass index (BMI) is used to identify weight problems. It estimates body fat based on height and weight. Your health care provider can help determine your BMI and help you achieve or maintain a healthy weight. Get regular exercise Get regular exercise. This is one of the most important things you can do for your health. Most adults should: Exercise for at least 150 minutes each week. The exercise should increase your heart rate and make you sweat (moderate-intensity exercise). Do strengthening exercises at least twice a week. This is in addition to the moderate-intensity exercise. Spend less time sitting. Even light physical activity can be beneficial. Watch cholesterol and blood lipids Have your blood tested for lipids and cholesterol at 50 years of age, then have this test every 5 years. Have your cholesterol levels checked more often if: Your lipid or cholesterol levels are high. You are older than 50 years of age. You are at high risk for heart disease. What should I know about cancer screening? Depending on your health  history and family history, you may need to have cancer screening at various ages. This may include screening for: Breast cancer. Cervical cancer. Colorectal cancer. Skin cancer. Lung cancer. What should I know about heart disease, diabetes, and high blood pressure? Blood pressure and heart disease High blood pressure causes heart disease and increases the risk of stroke. This is more likely to develop in people who have high blood pressure readings or are overweight. Have your blood pressure checked: Every 3-5 years if you are 9-53 years of age. Every year if you are 68 years old or older. Diabetes Have regular diabetes screenings. This checks your fasting blood sugar level. Have the screening done: Once every three years after age 25 if you are at a normal weight and have a low risk for diabetes. More often and at a younger age if you are overweight or have a high risk for diabetes. What should I know about preventing infection? Hepatitis B If you have a higher risk for hepatitis B, you should be screened for this virus. Talk with your health care provider to find out if you are at risk for hepatitis B infection. Hepatitis C Testing is recommended for: Everyone born from [birth year] through 1964. Anyone with known risk factors for hepatitis C. Sexually transmitted infections (STIs) Get screened for STIs, including gonorrhea and chlamydia, if: You are sexually active and are younger than 50 years of age. You are older than 50 years of age and your health care provider tells you that you are at risk for this type of  infection. Your sexual activity has changed since you were last screened, and you are at increased risk for chlamydia or gonorrhea. Ask your health care provider if you are at risk. Ask your health care provider about whether you are at high risk for HIV. Your health care provider may recommend a prescription medicine to help prevent HIV infection. If you choose to take medicine to  prevent HIV, you should first get tested for HIV. You should then be tested every 3 months for as long as you are taking the medicine. Pregnancy If you are about to stop having your period (premenopausal) and you may become pregnant, seek counseling before you get pregnant. Take 400 to 800 micrograms (mcg) of folic acid every day if you become pregnant. Ask for birth control (contraception) if you want to prevent pregnancy. Osteoporosis and menopause Osteoporosis is a disease in which the bones lose minerals and strength with aging. This can result in bone fractures. If you are 32 years old or older, or if you are at risk for osteoporosis and fractures, ask your health care provider if you should: Be screened for bone loss. Take a calcium or vitamin D supplement to lower your risk of fractures. Be given hormone replacement therapy (HRT) to treat symptoms of menopause. Follow these instructions at home: Alcohol use Do not drink alcohol if: Your health care provider tells you not to drink. You are pregnant, may be pregnant, or are planning to become pregnant. If you drink alcohol: Limit how much you have to: 0-1 drink a day. Know how much alcohol is in your drink. In the [place] one drink equals one 12 oz bottle of beer (355 mL), one 5 oz glass of wine (148 mL), or one 1 oz glass of hard liquor (44 mL). Lifestyle Do not use any products that contain nicotine or tobacco. These products include cigarettes, chewing tobacco, and vaping devices, such as e-cigarettes. If you need help quitting, ask your health care provider. Do not use street drugs. Do not share needles. Ask your health care provider for help if you need support or information about quitting drugs. General instructions Schedule regular health, dental, and eye exams. Stay current with your vaccines. Tell your health care provider if: You often feel depressed. You have ever been abused or do not feel safe at  home. Summary Adopting a healthy lifestyle and getting preventive care are important in promoting health and wellness. Follow your health care provider's instructions about healthy diet, exercising, and getting tested or screened for diseases. Follow your health care provider's instructions on monitoring your cholesterol and blood pressure. This information is not intended to replace advice given to you by your health care provider. Make sure you discuss any questions you have with your health care provider. Document Revised: 04/02/2021 Document Reviewed: 04/02/2021 Elsevier Patient Education  [place].

## 2022-08-19 NOTE — Assessment & Plan Note (Signed)
Encouraged healthy diet and lifestyle choices to affect sustainable weight loss.  She is planning to try Golo weight loss supplement/plan this year. Briefly discussed bariatric medication options specifically weekly Wegovy injection.

## 2022-08-19 NOTE — Progress Notes (Signed)
Patient ID: Jodi Young, female    DOB: 16-Oct-1971, 51 y.o.   MRN: [id number]  This visit was conducted in person.  BP 124/76   Pulse 91   Temp 97.9 F (36.6 C) (Temporal)   Ht 4' 9.5" (1.461 m)   Wt 160 lb 6 oz (72.7 kg)   LMP 07/15/2022   SpO2 98%   BMI 34.10 kg/m    CC: CPE Subjective:   HPI: Jodi Young is a 51 y.o. female presenting on 08/19/2022 for Annual Exam   [employer] - teacher AP chem and environmental science   HTN - compliant with amlodipine 10mg  and losartan 50mg  daily with good BP control. Limits salt/sodium, drinking water.   No recent menstrual migraines. Previously on climara patch. Notes improvement in migraines since she started drinking carbonated mineral water.   Obesity - looking into Golo supplement/weight loss plan.     Preventative: Colonoscopy 06/2020 WNL, rpt 5 yrs given fmhx ) Well woman with OBGYN Dr. 07/2020 yearly, last seen 04/2022 Mammogram -[facility] at @ [facility] 04/2022 - will request records  Perimenopausal - cycles every other month  LMP 07/15/2022 Birth control: s/p BTL Lung cancer screening - not eliglbe  Flu shot yearly COVID vaccine - Pfizer 01/2020 x2, bivalent booster 08/2021 (will send 02/2020 date) Tdap 2013,  Shingrix - 05/2022 Seat belt use  Sunscreen use discussed, no changing moles on skin  Sleep - averaging 8 hours/night Non smoker  Alcohol - rarely  Dentist q6 mo Eye exam yearly  Caffeine: 1 cup coffee, some soda or tea/day Lives with husband, 2 children (2004, 2011)   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 Interim medical history since our last visit reviewed. Allergies and medications  reviewed and updated. Outpatient Medications Prior to Visit  Medication Sig Dispense Refill   amLODipine (NORVASC) 10 MG tablet TAKE 1 TABLET BY MOUTH EVERY DAY 90 tablet 0   losartan (COZAAR) 50 MG tablet TAKE 1 TABLET BY MOUTH EVERY DAY 90 tablet 0   Cholecalciferol (VITAMIN D3) 25 MCG (1000 UT) CAPS Take 1 capsule (1,000 Units total) by mouth daily. (Patient not taking: Reported on 06/13/2020) 30 capsule    No facility-administered medications prior to visit.     Per HPI unless specifically indicated in ROS section below Review of Systems  Constitutional:  Positive for appetite change (decreased due to stress). Negative for activity change, chills, fatigue, fever and unexpected weight change.  HENT:  Negative for hearing loss.   Eyes:  Negative for visual disturbance.  Respiratory:  Negative for cough, chest tightness, shortness of breath and wheezing.   Cardiovascular:  Negative for chest pain, palpitations and leg swelling.  Gastrointestinal:  Negative for abdominal distention, abdominal pain, blood in stool, constipation, diarrhea, nausea and vomiting.  Genitourinary:  Negative for difficulty urinating and hematuria.  Musculoskeletal:  Negative for arthralgias, myalgias and neck pain.  Skin:  Negative for rash.  Neurological:  Negative for dizziness, seizures, syncope and headaches.  Hematological:  Negative for adenopathy. Does not bruise/bleed easily.  Psychiatric/Behavioral:  Negative for dysphoric mood. The patient is not nervous/anxious.     Objective:  BP 124/76   Pulse 91   Temp 97.9 F (  36.6 C) (Temporal)   Ht 4' 9.5" (1.461 m)   Wt 160 lb 6 oz (72.7 kg)   LMP 07/15/2022   SpO2 98%   BMI 34.10 kg/m   Wt Readings from Last 3 Encounters:  08/19/22 160 lb 6 oz (72.7 kg)  05/15/21 155 lb 8 oz (70.5 kg)  06/30/20 160 lb (72.6 kg)    Ht Readings from Last 3 Encounters:  08/19/22 4' 9.5" (1.461 m)  05/15/21 4' 9.5" (1.461 m)  06/30/20 4\' 10"  (1.473 m)       Physical Exam Vitals and nursing note reviewed.  Constitutional:      Appearance: Normal appearance. She is not ill-appearing.  HENT:     Head: Normocephalic and atraumatic.     Right Ear: Tympanic membrane, ear canal and external ear normal. There is no impacted cerumen.     Left Ear: Tympanic membrane, ear canal and external ear normal. There is no impacted cerumen.  Eyes:     General:        Right eye: No discharge.        Left eye: No discharge.     Extraocular Movements: Extraocular movements intact.     Conjunctiva/sclera: Conjunctivae normal.     Pupils: Pupils are equal, round, and reactive to light.  Neck:     Thyroid: No thyroid mass or thyromegaly.  Cardiovascular:     Rate and Rhythm: Normal rate and regular rhythm.     Pulses: Normal pulses.     Heart sounds: Normal heart sounds. No murmur heard. Pulmonary:     Effort: Pulmonary effort is normal. No respiratory distress.     Breath sounds: Normal breath sounds. No wheezing, rhonchi or rales.  Abdominal:     General: Bowel sounds are normal. There is no distension.     Palpations: Abdomen is soft. There is no mass.     Tenderness: There is no abdominal tenderness. There is no guarding or rebound.     Hernia: No hernia is present.  Musculoskeletal:     Cervical back: Normal range of motion and neck supple. No rigidity.     Right lower leg: No edema.     Left lower leg: No edema.  Lymphadenopathy:     Cervical: No cervical adenopathy.  Skin:    General: Skin is warm and dry.     Findings: No rash.  Neurological:     General: No focal deficit present.     Mental Status: She is alert. Mental status is at baseline.  Psychiatric:        Mood and Affect: Mood normal.        Behavior: Behavior normal.       Results for orders placed or performed in visit on 50/93/26  Basic metabolic panel  Result Value Ref Range   Sodium 137 135 - 145 mEq/L   Potassium 4.2 3.5 - 5.1 mEq/L   Chloride 102 96 - 112 mEq/L   CO2  26 19 - 32 mEq/L   Glucose, Bld 105 (H) 70 - 99 mg/dL   BUN 7 6 - 23 mg/dL   Creatinine, Ser 0.88 0.40 - 1.20 mg/dL   GFR 76.50 >60.00 mL/min   Calcium 9.6 8.4 - 10.5 mg/dL  IBC panel  Result Value Ref Range   Iron 91 42 - 145 ug/dL   Transferrin 274.0 212.0 - 360.0 mg/dL   Saturation Ratios 23.7 20.0 - 50.0 %   TIBC 383.6 250.0 - 450.0 mcg/dL  Ferritin  Result Value Ref Range   Ferritin 23.2 10.0 - 291.0 ng/mL  VITAMIN D 25 Hydroxy (Vit-D Deficiency, Fractures)  Result Value Ref Range   VITD 23.45 (L) 30.00 - 100.00 ng/mL  TSH  Result Value Ref Range   TSH 0.80 0.35 - 5.50 uIU/mL  CBC with Differential/Platelet  Result Value Ref Range   WBC 4.5 4.0 - 10.5 K/uL   RBC 4.74 3.87 - 5.11 Mil/uL   Hemoglobin 12.7 12.0 - 15.0 g/dL   HCT 34.7 42.5 - 95.6 %   MCV 80.7 78.0 - 100.0 fl   MCHC 33.1 30.0 - 36.0 g/dL   RDW 38.7 56.4 - 33.2 %   Platelets 214.0 150.0 - 400.0 K/uL   Neutrophils Relative % 48.2 43.0 - 77.0 %   Lymphocytes Relative 41.2 12.0 - 46.0 %   Monocytes Relative 8.3 3.0 - 12.0 %   Eosinophils Relative 1.6 0.0 - 5.0 %   Basophils Relative 0.7 0.0 - 3.0 %   Neutro Abs 2.2 1.4 - 7.7 K/uL   Lymphs Abs 1.9 0.7 - 4.0 K/uL   Monocytes Absolute 0.4 0.1 - 1.0 K/uL   Eosinophils Absolute 0.1 0.0 - 0.7 K/uL   Basophils Absolute 0.0 0.0 - 0.1 K/uL  Hepatitis C antibody  Result Value Ref Range   Hepatitis C Ab NON-REACTIVE NON-REACTIVE  Lipid panel  Result Value Ref Range   Cholesterol 150 0 - 200 mg/dL   Triglycerides 95.1 0.0 - 149.0 mg/dL   HDL 88.41 >66.06 mg/dL   VLDL 30.1 0.0 - 60.1 mg/dL   LDL Cholesterol 93 0 - 99 mg/dL   Total CHOL/HDL Ratio 3    NonHDL 104.64     Assessment & Plan:   Problem List Items Addressed This Visit     Healthcare maintenance - Primary (Chronic)    Preventative protocols reviewed and updated unless pt declined. Discussed healthy diet and lifestyle.       Vitamin D deficiency    Has not been taking vit D 1000 IU daily - will  Rx 50k units weekly over the next year.       Hypertension    Chronic, stable on amlodipine and losartan.       Relevant Medications   amLODipine (NORVASC) 10 MG tablet   losartan (COZAAR) 50 MG tablet   Obesity, Class I, BMI 30-34.9    Encouraged healthy diet and lifestyle choices to affect sustainable weight loss.  She is planning to try Golo weight loss supplement/plan this year. Briefly discussed bariatric medication options specifically weekly Wegovy injection.       Other Visit Diagnoses     Need for influenza vaccination       Relevant Orders   Flu Vaccine QUAD 4mo+IM (Fluarix, Fluzone & Alfiuria Quad PF) (Completed)   Need for Tdap vaccination       Relevant Orders   Tdap vaccine greater than or equal to 7yo IM (Completed)        Meds ordered this encounter  Medications   amLODipine (NORVASC) 10 MG tablet    Sig: Take 1 tablet (10 mg total) by mouth daily.    Dispense:  90 tablet    Refill:  3   losartan (COZAAR) 50 MG tablet    Sig: Take 1 tablet (50 mg total) by mouth daily.    Dispense:  90 tablet    Refill:  3   Cholecalciferol 1.25 MG (50000 UT) capsule    Sig: Take 1 capsule (50,000 Units total)  by mouth daily.    Dispense:  12 capsule    Refill:  3   Orders Placed This Encounter  Procedures   Flu Vaccine QUAD 53mo+IM (Fluarix, Fluzone & Alfiuria Quad PF)   Tdap vaccine greater than or equal to 7yo IM     Patient instructions: Flu shot today Tdap today  We will request mammogram records from [place].  [facility] with [facility] on type and date of COVID booster and send [place] through MyChart.  Start vitamin D 50,000 units weekly sent to pharmacy.  Good to see you today  Return as needed or in 1 year for next physical.   Follow up plan: Return in about 1 year (around 08/20/2023) for annual exam, prior fasting for blood work.  Eustaquio Boyden, MD

## 2022-08-19 NOTE — Assessment & Plan Note (Signed)
Chronic, stable on amlodipine and losartan.

## 2022-08-20 NOTE — Telephone Encounter (Signed)
Received following message from pharmacy:  Script Clarification: PLEASE CORRECT DIRECTIONS. THIS SHOULD BE PRESCRIBED WEEKLY, NOT DAILY.  Corrected rx sig per Dr. Synthia Innocent instructions. (See 08/19/22 OV notes.)  E-scribed new rx.  Fyi to Dr. Darnell Level.

## 2022-08-21 NOTE — Telephone Encounter (Signed)
Noted! Thank you

## 2022-08-22 ENCOUNTER — Encounter: Payer: Self-pay | Admitting: Family Medicine

## 2022-08-26 ENCOUNTER — Encounter: Payer: Self-pay | Admitting: Family Medicine

## 2022-10-16 ENCOUNTER — Ambulatory Visit (INDEPENDENT_AMBULATORY_CARE_PROVIDER_SITE_OTHER): Payer: Federal, State, Local not specified - PPO

## 2022-10-16 DIAGNOSIS — Z23 Encounter for immunization: Secondary | ICD-10-CM

## 2022-10-16 NOTE — Addendum Note (Signed)
Addended by: Vertis Kelch on: 10/16/2022 03:14 PM   Modules accepted: Orders

## 2022-10-16 NOTE — Progress Notes (Signed)
Per orders of Dr. Javier Gutierrez, injection of Shingrix given by Dalanie Kisner in left deltoid. Patient tolerated injection well.    

## 2023-04-10 ENCOUNTER — Encounter: Payer: Self-pay | Admitting: Family Medicine

## 2023-04-10 ENCOUNTER — Ambulatory Visit
Admission: RE | Admit: 2023-04-10 | Discharge: 2023-04-10 | Disposition: A | Discharge status: Home / Self Care | Payer: Federal, State, Local not specified - PPO | Attending: Family Medicine | Admitting: Family Medicine

## 2023-04-10 ENCOUNTER — Ambulatory Visit: Payer: Federal, State, Local not specified - PPO | Admitting: Family Medicine

## 2023-04-10 ENCOUNTER — Telehealth: Payer: Self-pay | Admitting: *Deleted

## 2023-04-10 ENCOUNTER — Ambulatory Visit
Admission: RE | Admit: 2023-04-10 | Discharge: 2023-04-10 | Disposition: A | Discharge status: Home / Self Care | Payer: Federal, State, Local not specified - PPO | Source: Ambulatory Visit | Attending: Family Medicine | Admitting: Family Medicine

## 2023-04-10 VITALS — BP 122/80 | HR 67 | Temp 98.0°F | Ht <= 58 in | Wt 161.5 lb

## 2023-04-10 DIAGNOSIS — J22 Unspecified acute lower respiratory infection: Secondary | ICD-10-CM | POA: Insufficient documentation

## 2023-04-10 DIAGNOSIS — R051 Acute cough: Secondary | ICD-10-CM | POA: Diagnosis not present

## 2023-04-10 DIAGNOSIS — R9431 Abnormal electrocardiogram [ECG] [EKG]: Secondary | ICD-10-CM | POA: Insufficient documentation

## 2023-04-10 DIAGNOSIS — R059 Cough, unspecified: Secondary | ICD-10-CM | POA: Diagnosis not present

## 2023-04-10 DIAGNOSIS — R0789 Other chest pain: Secondary | ICD-10-CM | POA: Diagnosis not present

## 2023-04-10 NOTE — Telephone Encounter (Signed)
Pt viewed results via my chart

## 2023-04-10 NOTE — Progress Notes (Signed)
Subjective:    Patient ID: Jodi Young, female    DOB: [birth year], 52 y.o.   MRN: [id number]  HPI 52 yo pt of Dr Reece Agar presents with cough and chest discomfort   She has a h/o HTN d takes losartan and amlodipine She works at a [employer] from Last 3 Encounters:  04/10/23 161 lb 8 oz (73.3 kg)  08/19/22 160 lb 6 oz (72.7 kg)  05/15/21 155 lb 8 oz (70.5 kg)   34.34 kg/m  Vitals:   04/10/23 0826  BP: 122/80  Pulse: 67  Temp: 98 F (36.7 C)  SpO2: 100%   Symptoms started about 6 pm last night Squeezing sensation on and off  Was driving - felt squeezing sensation in chest referring to the L side of her back  She lay down in back of car This was intermittent all night  Took an asa last night   Slept through the night  Symptoms stopped this am Now coughing   Is dry  Makes her chest sore  No sob  Perhaps a little wheezing  Pnd  Does not feel like she has a head cold   Some shooting pain in her joints - wrists/knees   Temp 99 at home  Had some chills last night   Has not done a covid test   Otc: Nothing yet     EKG today  Sinus tachycardia rate 103 Short PR Ectopic beats   Last metabolic panel Lab Results  Component Value Date   GLUCOSE 105 (H) 08/14/2022   NA 137 08/14/2022   K 4.2 08/14/2022   CL 102 08/14/2022   CO2 26 08/14/2022   BUN 7 08/14/2022   CREATININE 0.88 08/14/2022   CALCIUM 9.6 08/14/2022   PROT 7.2 03/29/2020   ALBUMIN 4.2 03/29/2020   BILITOT 0.5 03/29/2020   ALKPHOS 64 03/29/2020   AST 12 03/29/2020   ALT 11 03/29/2020   Lab Results  Component Value Date   WBC 4.5 08/14/2022   HGB 12.7 08/14/2022   HCT 38.2 08/14/2022   MCV 80.7 08/14/2022   PLT 214.0 08/14/2022   Lab Results  Component Value Date   TSH 0.80 08/14/2022    CXR today DG Chest 2 View  Result Date: 04/10/2023 CLINICAL DATA:  new cough with some sub sternal chest discomfort EXAM: CHEST - 2 VIEW COMPARISON:  None Available. FINDINGS: The  cardiomediastinal silhouette is normal in contour. No pleural effusion. No pneumothorax. No acute pleuroparenchymal abnormality. Visualized abdomen is unremarkable. No acute osseous abnormality noted. IMPRESSION: No acute cardiopulmonary abnormality. Electronically Signed   By: Meda Klinefelter M.D.   On: 04/10/2023 10:16    Patient Active Problem List   Diagnosis Date Noted   Cough 04/10/2023   Advanced care planning/counseling discussion 09/15/2019   No transfusions per religious beliefs 09/15/2019   Obesity, Class I, BMI 30-34.9 01/06/2018   Hypertension 12/11/2017   Vertigo 12/11/2017   Vitamin D deficiency 08/28/2015   Healthcare maintenance 10/29/2013   Anemia, iron deficiency 10/29/2013   Menstrual migraine    Thyroid nodule    Past Medical History:  Diagnosis Date   Elevated blood pressure reading without diagnosis of hypertension    Genital warts    History of chicken pox    Hx of migraines    menstrual   Hypertension    Multiple thyroid nodules    benign, followed by endo, last [place] 2008   No transfusions per religious beliefs 09/15/2019  Seasonal allergies    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   Past Surgical History:  Procedure Laterality Date   CESAREAN SECTION  11/25/2009   COLONOSCOPY  06/2020   WNL, rpt 5 yrs given fmhx Russella Dar)   WISDOM TOOTH EXTRACTION     Social History   Tobacco Use   Smoking status: Never   Smokeless tobacco: Never  Substance Use Topics   Alcohol use: Yes    Comment: Rarely   Drug use: No   Family History  Problem Relation Age of Onset   Colon polyps Mother    Hypertension Father    Hypertension Sister    Alcohol abuse Maternal Grandmother    Alcohol abuse Maternal Grandfather    Alcohol abuse Paternal Grandmother    Diabetes Paternal Grandmother    COPD Paternal Grandmother    Heart failure Paternal Grandmother    Alcohol abuse Paternal Grandfather    Colon cancer Maternal Aunt    Colon cancer  Maternal Uncle    Cancer Maternal Uncle        unsure primary   Leukemia Maternal Uncle 45   Cancer Cousin        unsure   Coronary artery disease Neg Hx    Stroke Neg Hx    Esophageal cancer Neg Hx    Stomach cancer Neg Hx    Rectal cancer Neg Hx    Allergies  Allergen Reactions   Ibuprofen Other (See Comments)    GI upset   Current Outpatient Medications on File Prior to Visit  Medication Sig Dispense Refill   amLODipine (NORVASC) 10 MG tablet Take 1 tablet (10 mg total) by mouth daily. 90 tablet 3   losartan (COZAAR) 50 MG tablet Take 1 tablet (50 mg total) by mouth daily. 90 tablet 3   No current facility-administered medications on file prior to visit.    Review of Systems  Constitutional:  Negative for activity change, appetite change, fatigue, fever and unexpected weight change.  HENT:  Negative for congestion, ear pain, rhinorrhea, sinus pressure and sore throat.   Eyes:  Negative for pain, redness and visual disturbance.  Respiratory:  Positive for cough. Negative for shortness of breath and wheezing.   Cardiovascular:  Positive for chest pain. Negative for palpitations and leg swelling.       Pt had chest pressure  Now that is resolved and she is coughing   Gastrointestinal:  Negative for abdominal pain, blood in stool, constipation and diarrhea.  Endocrine: Negative for polydipsia and polyuria.  Genitourinary:  Negative for dysuria, frequency and urgency.  Musculoskeletal:  Negative for arthralgias, back pain and myalgias.  Skin:  Negative for pallor and rash.  Allergic/Immunologic: Negative for environmental allergies.  Neurological:  Negative for dizziness, syncope and headaches.  Hematological:  Negative for adenopathy. Does not bruise/bleed easily.  Psychiatric/Behavioral:  Negative for decreased concentration and dysphoric mood. The patient is not nervous/anxious.        Objective:   Physical Exam Constitutional:      General: She is not in acute  distress.    Appearance: Normal appearance. She is well-developed. She is obese. She is not ill-appearing or diaphoretic.  HENT:     Head: Normocephalic and atraumatic.     Right Ear: Tympanic membrane and ear canal normal.     Left Ear: Tympanic membrane and ear canal normal.     Nose: Nose normal.     Comments: Boggy nares  Mouth/Throat:     Mouth: Mucous membranes are moist.     Pharynx: No oropharyngeal exudate or posterior oropharyngeal erythema.  Eyes:     General:        Right eye: No discharge.        Left eye: No discharge.     Conjunctiva/sclera: Conjunctivae normal.     Pupils: Pupils are equal, round, and reactive to light.  Neck:     Thyroid: No thyromegaly.     Vascular: No carotid bruit or JVD.  Cardiovascular:     Rate and Rhythm: Normal rate and regular rhythm.     Pulses: Normal pulses.     Heart sounds: Normal heart sounds. No murmur heard.    No gallop.  Pulmonary:     Effort: Pulmonary effort is normal. No respiratory distress.     Breath sounds: Normal breath sounds. No stridor. No wheezing, rhonchi or rales.     Comments: Clear lungs Good air exh  Her cough sounds barky / harsh and dry  Chest:     Chest wall: No tenderness.  Abdominal:     General: There is no distension or abdominal bruit.     Palpations: Abdomen is soft.     Tenderness: There is no abdominal tenderness.  Musculoskeletal:     Cervical back: Normal range of motion and neck supple.     Right lower leg: No edema.     Left lower leg: No edema.     Comments: No tenderness No palp cords  Lymphadenopathy:     Cervical: No cervical adenopathy.  Skin:    General: Skin is warm and dry.     Coloration: Skin is not pale.     Findings: No rash.  Neurological:     Mental Status: She is alert.     Coordination: Coordination normal.     Deep Tendon Reflexes: Reflexes are normal and symmetric. Reflexes normal.  Psychiatric:        Mood and Affect: Mood normal.           Assessment & Young:   Problem List Items Addressed This Visit       Other   Chest discomfort - Primary    This occurred yesterday  Pressure/ squeezing sensation  Then developed wheezy cough with clear sputum  EKG notes short PR/ ectopic beats  Clear cxr  Reassuring exam and vitals  Will update pcp  No cardiac history       Relevant Orders   EKG 12-Lead (Completed)   Cough    It think this is eary viral bronchitis  CXR is clear/ reassuring Cough is barky/wheezy sounding, but no wheeze on exam  Started with some chest pressure that is now resolved  Good pulse ox  Discussed symptom care=see AVS ER precautions given in detail         Relevant Orders   DG Chest 2 View (Completed)   Shortened PR interval    Noted on EKG Also some ectopic beats Pt had cp yesterday-now resolved/ turned into cough and bronchitis symptoms  Will d/w pcp  Reassuring exam today

## 2023-04-10 NOTE — Telephone Encounter (Signed)
Left VM requesting pt to call the office back 

## 2023-04-10 NOTE — Assessment & Plan Note (Addendum)
It think this is eary viral bronchitis  CXR is clear/ reassuring Cough is barky/wheezy sounding, but no wheeze on exam  Started with some chest pressure that is now resolved  Good pulse ox  Discussed symptom care=see AVS ER precautions given in detail

## 2023-04-10 NOTE — Assessment & Plan Note (Signed)
This occurred yesterday  Pressure/ squeezing sensation  Then developed wheezy cough with clear sputum  EKG notes short PR/ ectopic beats  Clear cxr  Reassuring exam and vitals  Will update pcp  No cardiac history

## 2023-04-10 NOTE — Patient Instructions (Addendum)
Go to the [facility] location for a chest xray  We will get back to you with results    Drink fluids and rest  mucinex DM is good for cough and congestion (if dry cough just delsym)  Nasal saline for congestion as needed  Tylenol for fever or pain or headache  Please alert [place] if symptoms worsen (if severe or short of breath please go to the ER)    Let us know if you get wheezy and tight

## 2023-04-10 NOTE — Telephone Encounter (Signed)
-----   Message from Judy Pimple, MD sent at 04/10/2023 10:44 AM EDT ----- Chest xray looks ok No pneumonia  Treat your symptoms and keep [place] updated Update if not starting to improve in a week or if worsening    Will cc your pcp

## 2023-04-10 NOTE — Assessment & Plan Note (Signed)
Noted on EKG Also some ectopic beats Pt had cp yesterday-now resolved/ turned into cough and bronchitis symptoms  Will d/w pcp  Reassuring exam today

## 2023-04-11 NOTE — Telephone Encounter (Signed)
Patient contacted the office regarding xray results/ visit from yesterday. States he has some question and would like a call back whenever possible. Please advise mobile, [phone].

## 2023-04-11 NOTE — Telephone Encounter (Signed)
Left message to return call to our office.  

## 2023-04-15 NOTE — Telephone Encounter (Signed)
Pt has a f/u with PCP on 04/18/23 to discuss this an her sxs she is still having

## 2023-04-18 ENCOUNTER — Ambulatory Visit: Payer: Federal, State, Local not specified - PPO | Admitting: Family Medicine

## 2023-04-18 ENCOUNTER — Encounter: Payer: Self-pay | Admitting: Family Medicine

## 2023-04-18 VITALS — BP 124/84 | HR 94 | Temp 97.4°F | Ht <= 58 in | Wt 160.5 lb

## 2023-04-18 DIAGNOSIS — R9431 Abnormal electrocardiogram [ECG] [EKG]: Secondary | ICD-10-CM

## 2023-04-18 DIAGNOSIS — J22 Unspecified acute lower respiratory infection: Secondary | ICD-10-CM

## 2023-04-18 DIAGNOSIS — R0789 Other chest pain: Secondary | ICD-10-CM

## 2023-04-18 DIAGNOSIS — I10 Essential (primary) hypertension: Secondary | ICD-10-CM

## 2023-04-18 LAB — POC COVID19 BINAXNOW: SARS Coronavirus 2 Ag: NEGATIVE

## 2023-04-18 MED ORDER — CHERATUSSIN AC 100-10 MG/5ML PO SOLN: 5.0000 mL | Freq: Three times a day (TID) | ORAL | 0 refills | Status: DC | PRN

## 2023-04-18 NOTE — Patient Instructions (Addendum)
COVID test today.  May use cheratussin for cough at night as needed.  Let us know if not improving with treatment. We will refer you to heart doctor for possible shortened PR interval on recent EKG.  Continue current medicines at this time.

## 2023-04-18 NOTE — Assessment & Plan Note (Deleted)
Agree with likely viral URI/bronchitis - check COVID swab today. Supportive measures reviewed.  Rx cheratussin cough syrup for night time PRN.  Update if not improving with treatment.

## 2023-04-18 NOTE — Assessment & Plan Note (Signed)
Anticipate related to viral bronchitis - this has largely resolved

## 2023-04-18 NOTE — Assessment & Plan Note (Signed)
BP remains stable on current regimen - continue

## 2023-04-18 NOTE — Progress Notes (Signed)
Ph: [phone] Fax: [phone]   Patient ID: Jodi Young, female    DOB: May 16, 1971, 52 y.o.   MRN: [id number]  This visit was conducted in person.  BP 124/84   Pulse 94   Temp (!) 97.4 F (36.3 C) (Temporal)   Ht 4' 9.5" (1.461 m)   Wt 160 lb 8 oz (72.8 kg)   SpO2 98%   BMI 34.13 kg/m    CC: follow up cough Subjective:   HPI: Jodi Young is a 52 y.o. female presenting on 04/18/2023 for Cough (Here for f/u. Seen 04/10/23 for sxs. C/o no improvement. However, body aches and fatigue have improved. Has occasional coughing episode. )   Seen by Dr Milinda Antis last week for cough and chest discomfort. Initial squeezing tightness sensation to chest that lasted 10-15 min, with radiation to L back that started while driving in car. Treated with aspirin and laying down in car. Subsequently developed body aches, dry cough. Had runny nose with PNdrainage.   Chest xray returned normal. EKG showed short PR with premature beats.  Denies palpitations.   Thought viral bronchitis. Supportive measures recommended.  Treated with tylenol, nasal saline and delsym.   Symptoms started Wednesday evening. She felt very tired this week. Cough is now mildly productive, intermittent, worse with prolonged talking or at night around 1am gets a coughing fit.   Yesterday fatigue started getting better.  Chest squeezing tightness overall resolved.  Chills without fever.  Body aches.  No ear or tooth pain, ST, abd pain, nausea, diarrhea, loss of taste/smell.   She did not do COVID test.  She has had COVID vaccines x3.   No fmhx blood clots No prolonged periods of immobility.  She continues norethindrone birth control.      Relevant past medical, surgical, family and social history reviewed and updated as indicated. Interim medical history since our last visit reviewed. Allergies and medications reviewed and updated. Outpatient Medications Prior to Visit  Medication Sig Dispense Refill    amLODipine (NORVASC) 10 MG tablet Take 1 tablet (10 mg total) by mouth daily. 90 tablet 3   losartan (COZAAR) 50 MG tablet Take 1 tablet (50 mg total) by mouth daily. 90 tablet 3   norethindrone (MICRONOR) 0.35 MG tablet Take 1 tablet (0.35 mg total) by mouth daily.     No facility-administered medications prior to visit.     Per HPI unless specifically indicated in ROS section below Review of Systems  Objective:  BP 124/84   Pulse 94   Temp (!) 97.4 F (36.3 C) (Temporal)   Ht 4' 9.5" (1.461 m)   Wt 160 lb 8 oz (72.8 kg)   SpO2 98%   BMI 34.13 kg/m   Wt Readings from Last 3 Encounters:  04/18/23 160 lb 8 oz (72.8 kg)  04/10/23 161 lb 8 oz (73.3 kg)  08/19/22 160 lb 6 oz (72.7 kg)      Physical Exam Vitals and nursing note reviewed.  Constitutional:      Appearance: Normal appearance. She is not ill-appearing.  HENT:     Head: Normocephalic and atraumatic.     Right Ear: Tympanic membrane, ear canal and external ear normal. There is no impacted cerumen.     Left Ear: Tympanic membrane, ear canal and external ear normal. There is no impacted cerumen.     Nose: Rhinorrhea present. No congestion.     Mouth/Throat:     Mouth: Mucous membranes are moist.  Pharynx: Oropharynx is clear. No oropharyngeal exudate or posterior oropharyngeal erythema.  Eyes:     Extraocular Movements: Extraocular movements intact.     Conjunctiva/sclera: Conjunctivae normal.     Pupils: Pupils are equal, round, and reactive to light.  Cardiovascular:     Rate and Rhythm: Normal rate and regular rhythm.     Pulses: Normal pulses.     Heart sounds: Normal heart sounds.  Pulmonary:     Effort: Pulmonary effort is normal. No respiratory distress.     Breath sounds: Normal breath sounds. No wheezing, rhonchi or rales.  Musculoskeletal:     Right lower leg: No edema.     Left lower leg: No edema.  Lymphadenopathy:     Head:     Right side of head: No submental, submandibular, tonsillar,  preauricular or posterior auricular adenopathy.     Left side of head: No submental, submandibular, tonsillar, preauricular or posterior auricular adenopathy.     Cervical: No cervical adenopathy.     Right cervical: No superficial cervical adenopathy.    Left cervical: No superficial cervical adenopathy.     Upper Body:     Right upper body: No supraclavicular adenopathy.     Left upper body: No supraclavicular adenopathy.  Skin:    Findings: No rash.  Neurological:     Mental Status: She is alert.  Psychiatric:        Mood and Affect: Mood normal.        Behavior: Behavior normal.       Results for orders placed or performed in visit on 04/18/23  POC COVID-19 BinaxNow  Result Value Ref Range   SARS Coronavirus 2 Ag Negative Negative   DG Chest 2 View CLINICAL DATA:  new cough with some sub sternal chest discomfort  EXAM: CHEST - 2 VIEW  COMPARISON:  None Available.  FINDINGS: The cardiomediastinal silhouette is normal in contour. No pleural effusion. No pneumothorax. No acute pleuroparenchymal abnormality. Visualized abdomen is unremarkable. No acute osseous abnormality noted.  IMPRESSION: No acute cardiopulmonary abnormality.  Electronically Signed   By: Meda Klinefelter M.D.   On: 04/10/2023 10:16  Assessment & Young:   Problem List Items Addressed This Visit     Hypertension    BP remains stable on current regimen - continue      Acute respiratory infection - Primary    Agree with likely viral URI/bronchitis - check COVID swab today. Supportive measures reviewed.  Rx cheratussin cough syrup for night time PRN.  Update if not improving with treatment.       Relevant Orders   POC COVID-19 BinaxNow (Completed)   Shortened PR interval    Noted on recent EKG - reviewed possible increased risk of arrhythmia - will refer to cardiology for further eval. Pt agrees with Young.       Relevant Orders   Ambulatory referral to Cardiology   Chest discomfort     Anticipate related to viral bronchitis - this has largely resolved        Meds ordered this encounter  Medications   guaiFENesin-codeine (CHERATUSSIN AC) 100-10 MG/5ML syrup    Sig: Take 5 mLs by mouth 3 (three) times daily as needed for cough (sedation precautions).    Dispense:  120 mL    Refill:  0    Orders Placed This Encounter  Procedures   Ambulatory referral to Cardiology    Referral Priority:   Routine    Referral Type:   Consultation  Referral Reason:   Specialty Services Required    Number of Visits Requested:   1   POC COVID-19 BinaxNow    Order Specific Question:   Previously tested for COVID-19    Answer:   Yes    Order Specific Question:   Resident in a congregate (group) care setting    Answer:   No    Order Specific Question:   Employed in healthcare setting    Answer:   No    Order Specific Question:   Pregnant    Answer:   No    Patient Instructions  COVID test today.  May use cheratussin for cough at night as needed.  Let us know if not improving with treatment. We will refer you to heart doctor for possible shortened PR interval on recent EKG.  Continue current medicines at this time.   Follow up Young: Return if symptoms worsen or fail to improve.  Eustaquio Boyden, MD

## 2023-04-18 NOTE — Assessment & Plan Note (Addendum)
Noted on recent EKG - reviewed possible increased risk of arrhythmia - will refer to cardiology for further eval. Pt agrees with plan.

## 2023-04-18 NOTE — Assessment & Plan Note (Signed)
Agree with likely viral URI/bronchitis - check COVID swab today. Supportive measures reviewed.  Rx cheratussin cough syrup for night time PRN.  Update if not improving with treatment.  

## 2023-05-08 DIAGNOSIS — K080 Exfoliation of teeth due to systemic causes: Secondary | ICD-10-CM | POA: Diagnosis not present

## 2023-05-15 DIAGNOSIS — R87610 Atypical squamous cells of undetermined significance on cytologic smear of cervix (ASC-US): Secondary | ICD-10-CM | POA: Diagnosis not present

## 2023-05-15 DIAGNOSIS — Z01419 Encounter for gynecological examination (general) (routine) without abnormal findings: Secondary | ICD-10-CM | POA: Diagnosis not present

## 2023-05-19 LAB — HM PAP SMEAR
HM Pap smear: ABNORMAL
HPV, high-risk: NEGATIVE

## 2023-06-26 ENCOUNTER — Encounter: Payer: Self-pay | Admitting: Internal Medicine

## 2023-06-26 ENCOUNTER — Ambulatory Visit: Payer: Federal, State, Local not specified - PPO | Attending: Internal Medicine | Admitting: Internal Medicine

## 2023-06-26 ENCOUNTER — Ambulatory Visit (INDEPENDENT_AMBULATORY_CARE_PROVIDER_SITE_OTHER): Payer: Federal, State, Local not specified - PPO

## 2023-06-26 VITALS — BP 122/70 | HR 86 | Ht <= 58 in | Wt 162.0 lb

## 2023-06-26 DIAGNOSIS — I493 Ventricular premature depolarization: Secondary | ICD-10-CM

## 2023-06-26 DIAGNOSIS — I10 Essential (primary) hypertension: Secondary | ICD-10-CM | POA: Diagnosis not present

## 2023-06-26 DIAGNOSIS — R9431 Abnormal electrocardiogram [ECG] [EKG]: Secondary | ICD-10-CM | POA: Diagnosis not present

## 2023-06-26 DIAGNOSIS — K219 Gastro-esophageal reflux disease without esophagitis: Secondary | ICD-10-CM | POA: Diagnosis not present

## 2023-06-26 NOTE — Patient Instructions (Addendum)
Medication Instructions:  Your physician recommends that you continue on your current medications as directed. Please refer to the Current Medication list given to you today.  *If you need a refill on your cardiac medications before your next appointment, please call your pharmacy*   Lab Work: NONE  If you have labs (blood work) drawn today and your tests are completely normal, you will receive your results only by: MyChart Message (if you have MyChart) OR A paper copy in the mail If you have any lab test that is abnormal or we need to change your treatment, we will call you to review the results.   Testing/Procedures: Your physician has requested that you wear a heart monitor.    Your physician has requested that you have an echocardiogram. Echocardiography is a painless test that uses sound waves to create images of your heart. It provides your doctor with information about the size and shape of your heart and how well your heart's chambers and valves are working. This procedure takes approximately one hour. There are no restrictions for this procedure. Please do NOT wear cologne, perfume, aftershave, or lotions (deodorant is allowed). Please arrive 15 minutes prior to your appointment time.    Follow-Up: At [facility], you and your health needs are our priority.  As part of our continuing mission to provide you with exceptional heart care, we have created designated Provider Care Teams.  These Care Teams include your primary Cardiologist (physician) and Advanced Practice Providers (APPs -  Physician Assistants and Nurse Practitioners) who all work together to provide you with the care you need, when you need it.   Your next appointment:   6-7 month(s)  Provider:  Jari Favre, PA,  Robin Searing, NP, or Tereso Newcomer, [place]     Other Instructions Jodi Young- Long Term Monitor Instructions  Your physician has requested you wear a ZIO patch monitor for 14 days.  This is a  single patch monitor. [employer] supplies one patch monitor per enrollment. Additional stickers are not available. Please do not apply patch if you will be having a Nuclear Stress Test,  Cardiac CT, MRI, or Chest Xray during the period you would be wearing the  monitor. The patch cannot be worn during these tests. You cannot remove and re-apply the  ZIO XT patch monitor.  Your ZIO patch monitor will be mailed 3 day USPS to your address on file. It may take 3-5 days  to receive your monitor after you have been enrolled.  Once you have received your monitor, please review the enclosed instructions. Your monitor  has already been registered assigning a specific monitor serial # to you.  Billing and Patient Assistance Program Information  We have supplied [employer] with any of your insurance information on file for billing purposes. [employer] offers a sliding scale Patient Assistance Program for patients that do not have  insurance, or whose insurance does not completely cover the cost of the ZIO monitor.  You must apply for the Patient Assistance Program to qualify for this discounted rate.  To apply, please call [employer] at [phone], select option 4, select option 2, ask to apply for  Patient Assistance Program. Meredeth Ide will ask your household income, and how many people  are in your household. They will quote your out-of-pocket cost based on that information.  [employer] will also be able to set up a 37-month, interest-free payment plan if needed.  Applying the monitor   Shave hair from upper left chest.  Hold abrader disc by orange tab. Rub abrader in 40 strokes over the upper left chest as  indicated in your monitor instructions.  Clean area with 4 enclosed alcohol pads. Let dry.  Apply patch as indicated in monitor instructions. Patch will be placed under collarbone on left  side of chest with arrow pointing upward.  Rub patch adhesive wings for 2 minutes. Remove white label marked "1".  Remove the white  label marked "2". Rub patch adhesive wings for 2 additional minutes.  While looking in a mirror, press and release button in center of patch. A small green light will  flash 3-4 times. This will be your only indicator that the monitor has been turned on.  Do not shower for the first 24 hours. You may shower after the first 24 hours.  Press the button if you feel a symptom. You will hear a small click. Record Date, Time and  Symptom in the Patient Logbook.  When you are ready to remove the patch, follow instructions on the last 2 pages of Patient  Logbook. Stick patch monitor onto the last page of Patient Logbook.  Place Patient Logbook in the blue and white box. Use locking tab on box and tape box closed  securely. The blue and white box has prepaid postage on it. Please place it in the mailbox as  soon as possible. Your physician should have your test results approximately 7 days after the  monitor has been mailed back to [facility].  Call [facility] Customer Care at [phone] if you have questions regarding  your ZIO XT patch monitor. Call them immediately if you see an orange light blinking on your  monitor.  If your monitor falls off in less than 4 days, contact our Monitor department at [phone].  If your monitor becomes loose or falls off after 4 days call [employer] at [phone] for  suggestions on securing your monitor

## 2023-06-26 NOTE — Progress Notes (Signed)
Cardiology Office Note:    Date:  06/26/2023   ID:  Jodi Young, Jodi Young 05-15-1971, MRN [id number]  PCP:  Jodi Boyden, MD   [facility] Providers Cardiologist:  Jodi Constant, MD     Referring MD: Jodi Boyden, MD   CC: PVCs Consulted for the evaluation of  PVCs at the behest of Dr. Sharen Young  History of Present Illness:    Jodi Young is a 52 y.o. female with a hx of short PR interval NOS and HTN.  Found to have PVCs.  Patient notes that she is feeling ok.   On day of evaluation she was having squeezing in her chest.  That lead to EKG. Found to have a viral prodrome (in May 2024).  Able to take care of her active daughters: One is 68, one is 66.  She teaches high school.    Still has rare chest squeezing.  Not provoked by exercise. Squeezing is more prominent at night. Sometimes makes her anxious. Sometimes associated gas. Patient exertion notable for exercising on the epiotically and feels no symptoms.    No shortness of breath, DOE .  No PND or orthopnea.  No weight gain, leg swelling , or abdominal swelling.  No syncope or near syncope. Notes heart heart fluttering and  palpitations when she is not .     FHX of CHF and COPD (grandmother).    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   guaiFENesin-codeine (CHERATUSSIN AC) 100-10 MG/5ML syrup Take 5 mLs by mouth 3 (three) times daily as needed for cough (sedation precautions).   losartan (COZAAR) 50 MG tablet Take 1 tablet (50 mg total) by mouth daily.   norethindrone (MICRONOR) 0.35 MG tablet Take 1 tablet (0.35 mg total) by mouth daily.     Allergies:   Ibuprofen   Social History   Socioeconomic History   Marital status: Married    Spouse name: Not on file   Number of children: Not on file   Years of education: Not on file   Highest education level: Not on file  Occupational History   Not on file  Tobacco Use   Smoking status: Never   Smokeless tobacco: Never  Substance and Sexual Activity   Alcohol use: Yes    Comment: Rarely   Drug use: No   Sexual activity: Not on file  Other Topics Concern   Not on file  Social History Narrative   Caffeine: 2 cups decaf coffee, soda, tea/day   Lives with husband, 2 children (2004, 2011)   Occupation: was Nurse, mental health - community health education, 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  Some walking, recently joined gym   Diet: good water daily, vegetables daily   Social Determinants of Health   Financial Resource Strain: Not on file  Food Insecurity: Not on file  Transportation Needs: Not on file  Physical Activity: Not on file  Stress: Not on file  Social Connections: Not on file     Family History: The patient's family history includes Alcohol abuse in her maternal grandfather, maternal grandmother, paternal grandfather, and paternal grandmother; COPD in her paternal grandmother; Cancer in her cousin and maternal uncle; Colon cancer in her maternal aunt and maternal uncle; Colon polyps in her mother; Diabetes in her paternal grandmother; Heart failure in her paternal grandmother; Hypertension in her father and sister; Leukemia (age of onset: 51) in her maternal uncle. There is  no history of Coronary artery disease, Stroke, Esophageal cancer, Stomach cancer, or Rectal cancer.  ROS:   Please see the history of present illness.     EKGs/Labs/Other Studies Reviewed:    The following studies were reviewed today:  EKG Interpretation Date/Time:  Thursday June 26 2023 08:21:57 EDT Ventricular Rate:  86 PR Interval:  132 QRS Duration:  82 QT Interval:  362 QTC Calculation: 433 R Axis:   16  Text Interpretation: Sinus rhythm with sinus arrhythmia with occasional Premature ventricular complexes Premature ventricular complexes Less frequent than prior OSH tracing Confirmed by Jodi Young ([id number]) on 06/26/2023 8:28:09 AM    Recent Labs: 08/14/2022: BUN 7; Creatinine, Ser 0.88; Hemoglobin 12.7; Platelets 214.0; Potassium 4.2; Sodium 137; TSH 0.80  Recent Lipid Panel    Component Value Date/Time   CHOL 150 08/14/2022 0741   TRIG 58.0 08/14/2022 0741   HDL 45.70 08/14/2022 0741   CHOLHDL 3 08/14/2022 0741   VLDL 11.6 08/14/2022 0741   LDLCALC 93 08/14/2022 0741    Physical Exam:    VS:  BP 122/70   Pulse 86   Ht 4\' 10"  (1.473 m)   Wt 162 lb (73.5 kg)   SpO2 98%   BMI 33.86 kg/m     Wt Readings from Last 3 Encounters:  06/26/23 162 lb (73.5 kg)  04/18/23 160 lb 8 oz (72.8 kg)  04/10/23 161 lb 8 oz (73.3 kg)     GEN:  Well nourished, well developed in no acute distress HEENT: Normal NECK: No JVD CARDIAC: largely , no murmurs, rubs, gallops RESPIRATORY:  Clear to auscultation without rales, wheezing or rhonchi  ABDOMEN: Soft, non-tender, non-distended MUSCULOSKELETAL:  No edema; No deformity  SKIN: Warm and dry NEUROLOGIC:  Alert and oriented x 3 PSYCHIATRIC:  Normal affect   ASSESSMENT:    1. PVC's (premature ventricular contractions)   2. Shortened PR interval   3. Primary hypertension   4. Gastroesophageal reflux disease without esophagitis    PLAN:    PVCs - frequent outflow tract  - suspect benign, will get Echo and  ziopatch  Chest Squeezing - may be palpitations vs GERD - can try TOC PPI and not eat at night   Short PR Interval - no evidence of WPW  HTN - controlled on amlodipine and losartan  Winter follow up with team       Medication Adjustments/Labs and Tests Ordered: Current medicines are reviewed at length with the patient today.  Concerns regarding medicines are outlined above.  Orders Placed This Encounter  Procedures   LONG TERM MONITOR (3-14 DAYS)   EKG 12-Lead   ECHOCARDIOGRAM COMPLETE   No orders of the defined  types were placed in this encounter.   Patient Instructions  Medication Instructions:  Your physician recommends that you continue on your current medications as directed. Please refer to the Current Medication list given to you today.  *If you need a refill on your cardiac medications before your next appointment, please call your pharmacy*   Lab Work: NONE  If you have labs (blood work) drawn today and your tests are completely normal, you will receive your results only by: MyChart Message (if you have MyChart) OR A paper copy in the mail If you have any lab test that is abnormal or we need to change your treatment, we will call you to review the results.   Testing/Procedures: Your physician has requested that you wear a heart monitor.    Your physician has requested that you have an echocardiogram. Echocardiography is a painless test that uses sound waves to create images of your heart. It provides your doctor with information about the size and shape of your heart and how well your heart's chambers and valves are working. This procedure takes approximately one hour. There are no restrictions for this procedure. Please do NOT wear cologne, perfume, aftershave, or lotions (deodorant is allowed). Please arrive 15 minutes prior to your appointment time.    Follow-Up: At [facility], you and your health needs are our priority.  As part of our  continuing mission to provide you with exceptional heart care, we have created designated Provider Care Teams.  These Care Teams include your primary Cardiologist (physician) and Advanced Practice Providers (APPs -  Physician Assistants and Nurse Practitioners) who all work together to provide you with the care you need, when you need it.   Your next appointment:   6-7 month(s)  Provider:  Jari Favre, PA,  Robin Searing, NP, or Tereso Newcomer, [place]     Other Instructions Christena Deem- Long Term Monitor Instructions  Your physician has requested you wear a ZIO patch monitor for 14 days.  This is a single patch monitor. [employer] supplies one patch monitor per enrollment. Additional stickers are not available. Please do not apply patch if you will be having a Nuclear Stress Test,  Cardiac CT, MRI, or Chest Xray during the period you would be wearing the  monitor. The patch cannot be worn during these tests. You cannot remove and re-apply the  ZIO XT patch monitor.  Your ZIO patch monitor will be mailed 3 day USPS to your address on file. It may take 3-5 days  to receive your monitor after you have been enrolled.  Once you have received your monitor, please review the enclosed instructions. Your monitor  has already been registered assigning a specific monitor serial # to you.  Billing and Patient Assistance Program Information  We have supplied [employer] with any of your insurance information on file for billing purposes. [employer] offers a sliding scale Patient Assistance Program for patients that do not have  insurance, or whose insurance does not completely cover the cost of the ZIO monitor.  You must apply for the Patient Assistance Program to qualify for this discounted rate.  To apply, please call [employer] at [phone], select option 4, select option 2, ask to apply for  Patient Assistance Program. Meredeth Ide will ask your household income, and how many people  are in your household. They will quote  your out-of-pocket cost based on that information.  [employer] will also be able to set up a 4-month, interest-free payment plan if needed.  Applying the monitor   Shave hair from upper left chest.  Hold abrader disc by orange tab. Rub abrader in 40 strokes over the upper left chest as  indicated in your monitor instructions.  Clean area with 4 enclosed alcohol pads. Let dry.  Apply patch as indicated in monitor instructions. Patch will be placed under collarbone on left  side of chest with arrow pointing upward.  Rub patch adhesive wings for 2 minutes. Remove white label marked "1". Remove the white  label marked "2". Rub patch adhesive wings for 2 additional minutes.  While looking in a mirror, press and release button in center of patch. A small green light will  flash 3-4 times. This will be your only indicator that the monitor has been turned on.  Do not shower for the first 24 hours. You may shower after the first 24 hours.  Press the button if you feel a symptom. You will hear a small click. Record Date, Time and  Symptom in the Patient Logbook.  When you are ready to remove the patch, follow instructions on the last 2 pages of Patient  Logbook. Stick patch monitor onto the last page of Patient Logbook.  Place Patient Logbook in the blue and white box. Use locking tab on box and tape box closed  securely. The blue and white box has prepaid postage on it. Please place it in the mailbox as  soon as possible. Your physician should have your test results approximately 7 days after the  monitor has been mailed back to [facility].  Call [facility] Customer Care at [phone] if you have questions regarding  your ZIO XT patch monitor. Call them immediately if you see an orange light blinking on your  monitor.  If your monitor falls off in less than 4 days, contact our Monitor department at [phone].  If your monitor becomes loose or falls off after 4 days call [employer] at  [phone] for  suggestions on securing your monitor     Signed, Jodi Constant, MD  06/26/2023 8:59 AM    [facility]

## 2023-06-26 NOTE — Progress Notes (Unsigned)
Enrolled for [employer] to mail a ZIO XT long term holter monitor to the patients address on file.  

## 2023-06-30 DIAGNOSIS — I10 Essential (primary) hypertension: Secondary | ICD-10-CM

## 2023-06-30 DIAGNOSIS — R9431 Abnormal electrocardiogram [ECG] [EKG]: Secondary | ICD-10-CM

## 2023-06-30 DIAGNOSIS — I493 Ventricular premature depolarization: Secondary | ICD-10-CM

## 2023-07-15 ENCOUNTER — Ambulatory Visit (HOSPITAL_COMMUNITY): Payer: Federal, State, Local not specified - PPO | Attending: Cardiology

## 2023-07-15 ENCOUNTER — Telehealth: Payer: Self-pay | Admitting: Internal Medicine

## 2023-07-15 DIAGNOSIS — R9431 Abnormal electrocardiogram [ECG] [EKG]: Secondary | ICD-10-CM | POA: Diagnosis not present

## 2023-07-15 DIAGNOSIS — I493 Ventricular premature depolarization: Secondary | ICD-10-CM | POA: Diagnosis not present

## 2023-07-15 DIAGNOSIS — I10 Essential (primary) hypertension: Secondary | ICD-10-CM | POA: Diagnosis not present

## 2023-07-15 LAB — ECHOCARDIOGRAM COMPLETE
Area-P 1/2: 4.49 cm2
Calc EF: 44.9 %
S' Lateral: 4.5 cm
Single Plane A2C EF: 44.3 %
Single Plane A4C EF: 47.7 %

## 2023-07-15 NOTE — Telephone Encounter (Signed)
Called about test results.  Left VM.  Will reach by phone if able, if not will send result note.  Riley Lam, MD FASE [facility] Cardiologist [facility]  [address] [place], [address] [place], [place] [address] [phone]  1:33 PM

## 2023-07-18 ENCOUNTER — Telehealth: Payer: Self-pay | Admitting: Internal Medicine

## 2023-07-18 DIAGNOSIS — I340 Nonrheumatic mitral (valve) insufficiency: Secondary | ICD-10-CM

## 2023-07-18 DIAGNOSIS — N63 Unspecified lump in unspecified breast: Secondary | ICD-10-CM | POA: Diagnosis not present

## 2023-07-18 DIAGNOSIS — R922 Inconclusive mammogram: Secondary | ICD-10-CM | POA: Diagnosis not present

## 2023-07-18 NOTE — Telephone Encounter (Signed)
Spoke with patient to return call and review ECHO results and recommendations from Dr Izora Ribas.  Will place order for cardiac MRI. Patient verbalized understanding and had no questions.

## 2023-07-18 NOTE — Telephone Encounter (Signed)
Please reach out to patient regarding results - [phone] (this is the best contact number for patient)

## 2023-07-24 ENCOUNTER — Telehealth: Payer: Self-pay | Admitting: Internal Medicine

## 2023-07-24 ENCOUNTER — Encounter: Payer: Self-pay | Admitting: Internal Medicine

## 2023-07-24 DIAGNOSIS — I493 Ventricular premature depolarization: Secondary | ICD-10-CM

## 2023-07-24 DIAGNOSIS — I340 Nonrheumatic mitral (valve) insufficiency: Secondary | ICD-10-CM

## 2023-07-24 NOTE — Telephone Encounter (Signed)
Pt returning nurses call regarding results. Please advise 

## 2023-07-25 ENCOUNTER — Telehealth: Payer: Self-pay | Admitting: Internal Medicine

## 2023-07-25 MED ORDER — METOPROLOL SUCCINATE ER 25 MG PO TB24
25.0000 mg | ORAL_TABLET | Freq: Every day | ORAL | 3 refills | Status: DC
Start: 2023-07-25 — End: 2023-09-30

## 2023-07-25 NOTE — Telephone Encounter (Signed)
Follow Up:     Patient said she was supposed to call Jodi Young back.

## 2023-07-25 NOTE — Addendum Note (Signed)
Addended by: Macie Burows on: 07/25/2023 10:32 AM   Modules accepted: Orders

## 2023-07-25 NOTE — Telephone Encounter (Signed)
Results:  Frequent PVCs (nearly 30%)  With HFrEF  Plan:  NM Stress test  Metoprolol succinate 25 mg PO daily  Pharmd D visit (may wean norvasc to start GDMT)   Christell Constant, MD   The patient has been notified of the result and verbalized understanding.  All questions (if any) were answered. Macie Burows, RN 07/25/2023 9:40 AM   Reviewed instructions for NMST will also place on my chart for pt to review.  Reviewed instructions for metoprolol succinate verified pharmacy on file.  Pt reports no one reviewed Cmri instructions.  Pt students coming into class advised I will place instructions on my chart.  If has questions pt advised to call in or send a my chart message.

## 2023-07-25 NOTE — Telephone Encounter (Signed)
Encounter not needed.  Please see encounter from 07/24/23.

## 2023-07-26 ENCOUNTER — Other Ambulatory Visit: Payer: Self-pay | Admitting: Family Medicine

## 2023-07-26 DIAGNOSIS — I10 Essential (primary) hypertension: Secondary | ICD-10-CM

## 2023-07-30 ENCOUNTER — Encounter: Payer: Self-pay | Admitting: Family Medicine

## 2023-07-30 DIAGNOSIS — I5022 Chronic systolic (congestive) heart failure: Secondary | ICD-10-CM | POA: Insufficient documentation

## 2023-07-30 DIAGNOSIS — I5020 Unspecified systolic (congestive) heart failure: Secondary | ICD-10-CM | POA: Insufficient documentation

## 2023-07-30 NOTE — Addendum Note (Signed)
Addended by: Macie Burows on: 07/30/2023 09:41 AM   Modules accepted: Orders

## 2023-08-01 NOTE — Addendum Note (Signed)
Addended by: Riley Lam A on: 08/01/2023 05:02 PM   Modules accepted: Orders

## 2023-08-05 ENCOUNTER — Encounter (HOSPITAL_COMMUNITY): Payer: Self-pay | Admitting: Internal Medicine

## 2023-08-07 ENCOUNTER — Ambulatory Visit: Payer: Federal, State, Local not specified - PPO | Attending: Cardiology | Admitting: Pharmacist

## 2023-08-07 VITALS — BP 122/70 | HR 53

## 2023-08-07 DIAGNOSIS — I5020 Unspecified systolic (congestive) heart failure: Secondary | ICD-10-CM | POA: Diagnosis not present

## 2023-08-07 MED ORDER — ENTRESTO 49-51 MG PO TABS: 1.0000 | ORAL_TABLET | Freq: Two times a day (BID) | ORAL | 3 refills | Status: DC

## 2023-08-07 NOTE — Patient Instructions (Addendum)
Please STOP losartan and amlodipine START Entresto 49/51mg  twice a day Continue metoprolol succinate 25mg  daily Continue to monitor blood pressure and heart rate Please call me at [phone] with any questions Don't forget to get your discount coupon from [email or web address]

## 2023-08-07 NOTE — Assessment & Plan Note (Addendum)
Assessment: Blood pressure currently well controlled HR ok but no room to titrate metoprolol Since she has been on losartan and EF has dropped, will change to Entresto 49/51mg  BID Reviewed GDMT plan and pillars  Plan: STOP losartan Start Entresto 49/51mg  BID Stop amlodipine. Advised that if after a few days off amlodpine and on Entresto her blood pressure increased to the 140's or higher she can resume amlodipine at 5mg  but I didn't want to continue and cause hypotension since Entresto is much stronger than losartan generally Follow up in 2 weeks- will need BMP Plans to add spiro and then SGLT2 last

## 2023-08-07 NOTE — Progress Notes (Signed)
Patient ID: NAKITTA WAFFLE                 DOB: 07/21/1971                      MRN: [id number]     HPI: Jodi Young is a 52 y.o. female referred by Dr. Izora Ribas to pharmacy clinic for HF medication management. PMH is significant for hx of short PR interval NOS, HTN, PVC and newly diagnosed HF with mildly reduced EF. Most recent LVEF 40-45%.  Started on metoprolol succinate 25mg  daily for PVC/HF 07/25/23.   Discussion with patient today included the following: cardiac medication indications, introduction to GDMT clinic, reasoning behind medication titration, importance of medication adherence, and patient engagement. Reports that she was feeling more tired this week, didn't know if it was from work or medication. States she was feeling better today. We discussed that BB can cause fatigue, but usually improves within 2 weeks. Denies any swelling or SOB. HR at home has been in the high 50's or 60's. We discussed options that inculuded changing losartan to Entresto and decreasing or stopping amlodipine. Discussed the goal would be to get her also on a SGLT and spiro.  Reviewed appropriate blood pressures/HR (>100 systolic but <130) HR >55.    Current CHF meds: metoprolol succinate 25mg  daily, losartan 50mg  daily Other HTN meds: amlodipine 10mg  daily Previously tried:  Adherence Assessment  Do you ever forget to take your medication? [] Yes [x] No  Do you ever skip doses due to side effects? [] Yes [x] No  Do you have trouble affording your medicines? [] Yes [x] No  Are you ever unable to pick up your medication due to transportation difficulties? [] Yes [x] No  Do you ever stop taking your medications because you don't believe they are helping? [] Yes [x] No  Do you check your weight daily? [] Yes [x] No    BP goal: <130/80  Family History:  Family History  Problem Relation Age of Onset   Colon polyps Mother    Hypertension Father    Hypertension Sister    Alcohol abuse Maternal  Grandmother    Alcohol abuse Maternal Grandfather    Alcohol abuse Paternal Grandmother    Diabetes Paternal Grandmother    COPD Paternal Grandmother    Heart failure Paternal Grandmother    Alcohol abuse Paternal Grandfather    Colon cancer Maternal Aunt    Colon cancer Maternal Uncle    Cancer Maternal Uncle        unsure primary   Leukemia Maternal Uncle 45   Cancer Cousin        unsure   Coronary artery disease Neg Hx    Stroke Neg Hx    Esophageal cancer Neg Hx    Stomach cancer Neg Hx    Rectal cancer Neg Hx      Social History:  Social History   Socioeconomic History   Marital status: Married    Spouse name: Not on file   Number of children: Not on file   Years of education: Not on file   Highest education level: Not on file  Occupational History   Not on file  Tobacco Use   Smoking status: Never   Smokeless tobacco: Never  Substance and Sexual Activity   Alcohol use: Yes    Comment: Rarely   Drug use: No   Sexual activity: Not on file  Other Topics Concern   Not on file  Social History Narrative   Caffeine: 2  cups decaf coffee, soda, tea/day   Lives with husband, 2 children (2004, 2011)   Occupation: was Nurse, mental health - community health education, 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  Some walking, recently joined gym   Diet: good water daily, vegetables daily   Social Determinants of Health   Financial Resource Strain: Not on file  Food Insecurity: Not on file  Transportation Needs: Not on file  Physical Activity: Not on file  Stress: Not on file  Social Connections: Not on file  Intimate Partner Violence: Not on file    Diet: not discussed at this visit   Exercise: not dicussed at this visit  Home BP readings: 127/80 HR 59 mainly 120's/80 108/76, 113/80, 110/81, 104/71  Wt Readings from Last 3 Encounters:  06/26/23 162 lb (73.5 kg)  04/18/23 160 lb 8 oz (72.8 kg)  04/10/23  161 lb 8 oz (73.3 kg)   BP Readings from Last 3 Encounters:  08/07/23 122/70  06/26/23 122/70  04/18/23 124/84   Pulse Readings from Last 3 Encounters:  08/07/23 (!) 53  06/26/23 86  04/18/23 94    Renal function: CrCl cannot be calculated (Patient's most recent lab result is older than the maximum 21 days allowed.).  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 120 mL 0   metoprolol succinate (TOPROL XL) 25 MG 24 hr tablet Take 1 tablet (25 mg total) by mouth daily. 90 tablet 3   norethindrone (MICRONOR) 0.35 MG tablet Take 1 tablet (0.35 mg total) by mouth daily.     No current facility-administered medications on file prior to visit.    Allergies  Allergen Reactions   Ibuprofen Other (See Comments)    GI upset     Assessment/Plan:  1. CHF -  HFrEF (heart failure with reduced ejection fraction) (HCC) Assessment: Blood pressure currently well controlled HR ok but no room to titrate metoprolol Since she has been on losartan and EF has dropped, will change to Entresto 49/51mg  BID Reviewed GDMT plan and pillars  Plan: STOP losartan Start Entresto 49/51mg  BID Stop amlodipine. Advised that if after a few days off amlodpine and on Entresto her blood pressure increased to the 140's or higher she can resume amlodipine at 5mg  but I didn't want to continue and cause hypotension since  Entresto is much stronger than losartan generally Follow up in 2 weeks- will need BMP Plans to add spiro and then SGLT2 last  Get copay card from [email or web address]  Thank you   Olene Floss, Pharm.D, BCPS, CPP [facility] A Division of [facility] [address] [place], [place] [address]  Phone: [phone]; Fax: [phone]

## 2023-08-18 ENCOUNTER — Other Ambulatory Visit: Payer: Federal, State, Local not specified - PPO

## 2023-08-21 ENCOUNTER — Ambulatory Visit: Payer: Federal, State, Local not specified - PPO | Attending: Cardiovascular Disease | Admitting: Pharmacist

## 2023-08-21 ENCOUNTER — Other Ambulatory Visit: Payer: Self-pay | Admitting: Pharmacist

## 2023-08-21 ENCOUNTER — Encounter: Payer: Self-pay | Admitting: Pharmacist

## 2023-08-21 VITALS — BP 120/86 | HR 62

## 2023-08-21 DIAGNOSIS — I10 Essential (primary) hypertension: Secondary | ICD-10-CM

## 2023-08-21 DIAGNOSIS — I5020 Unspecified systolic (congestive) heart failure: Secondary | ICD-10-CM | POA: Diagnosis not present

## 2023-08-21 LAB — BASIC METABOLIC PANEL
BUN/Creatinine Ratio: 10 (ref 9–23)
BUN: 9 mg/dL (ref 6–24)
CO2: 20 mmol/L (ref 20–29)
Calcium: 9.7 mg/dL (ref 8.7–10.2)
Chloride: 105 mmol/L (ref 96–106)
Creatinine, Ser: 0.87 mg/dL (ref 0.57–1.00)
Glucose: 101 mg/dL — ABNORMAL HIGH (ref 70–99)
Potassium: 4.3 mmol/L (ref 3.5–5.2)
Sodium: 138 mmol/L (ref 134–144)
eGFR: 81 mL/min/{1.73_m2} (ref >59)

## 2023-08-21 MED ORDER — SPIRONOLACTONE 25 MG PO TABS: 12.5000 mg | ORAL_TABLET | Freq: Every day | ORAL | 3 refills | Status: DC

## 2023-08-21 NOTE — Patient Instructions (Addendum)
Continue Entresto 24/26mg  twice a day and metoprolol 25mg  daily I will call you on Monday with your lab results and any medication changes

## 2023-08-21 NOTE — Assessment & Plan Note (Signed)
Assessment: Blood pressure is slightly elevated today in clinic No home readings available today Patient tolerating new medications (Entresto) well 1 episode of dizziness but has not reoccurred We will need to check BMP today  Plan: Check BMP today If labs are stable we will plan to add spironolactone Continue Entresto 49 to 51 mg twice daily and metoprolol succinate 25 mg daily Heart rate about 60 therefore no room to titrate metoprolol Plan to eventually add SGLT2 Will need follow-up scheduled once plan is set after labs. most likely follow-up in about 2 weeks

## 2023-08-25 ENCOUNTER — Encounter: Payer: Federal, State, Local not specified - PPO | Admitting: Family Medicine

## 2023-09-09 ENCOUNTER — Encounter (HOSPITAL_COMMUNITY): Payer: Self-pay

## 2023-09-11 NOTE — Telephone Encounter (Signed)
Left detailed instructions on home answering machine about scheduled STRESS TEST on 09/12/23 at 8:00.

## 2023-09-12 ENCOUNTER — Ambulatory Visit (HOSPITAL_COMMUNITY): Payer: Federal, State, Local not specified - PPO | Attending: Internal Medicine

## 2023-09-12 ENCOUNTER — Ambulatory Visit: Payer: Federal, State, Local not specified - PPO | Admitting: Pharmacist

## 2023-09-12 ENCOUNTER — Encounter: Payer: Self-pay | Admitting: Pharmacist

## 2023-09-12 DIAGNOSIS — I340 Nonrheumatic mitral (valve) insufficiency: Secondary | ICD-10-CM | POA: Insufficient documentation

## 2023-09-12 DIAGNOSIS — I493 Ventricular premature depolarization: Secondary | ICD-10-CM | POA: Insufficient documentation

## 2023-09-12 DIAGNOSIS — I5020 Unspecified systolic (congestive) heart failure: Secondary | ICD-10-CM | POA: Insufficient documentation

## 2023-09-12 LAB — MYOCARDIAL PERFUSION IMAGING
Angina Index: 0
Duke Treadmill Score: 6
Estimated workload: 7
Exercise duration (min): 6 min
Exercise duration (sec): 0 s
LV dias vol: 88 mL (ref 46–106)
LV sys vol: 57 mL
MPHR: 169 {beats}/min
Nuc Stress EF: 35 %
Peak HR: 176 {beats}/min
Percent HR: 104 %
Rest HR: 97 {beats}/min
Rest Nuclear Isotope Dose: 10.5 mCi
SDS: 3
SRS: 2
SSS: 5
ST Depression (mm): 0 mm
Stress Nuclear Isotope Dose: 32 mCi
TID: 1.01

## 2023-09-12 MED ORDER — SPIRONOLACTONE 25 MG PO TABS: 25.0000 mg | ORAL_TABLET | Freq: Every day | ORAL | 3 refills | Status: DC

## 2023-09-12 MED ORDER — TECHNETIUM TC 99M SESTAMIBI GENERIC - CARDIOLITE
10.5000 | Freq: Once | INTRAVENOUS | Status: AC | PRN
  Administered 2023-09-12: 10.5 via INTRAVENOUS

## 2023-09-12 MED ORDER — TECHNETIUM TC 99M TETROFOSMIN IV KIT
32.0000 | PACK | Freq: Once | INTRAVENOUS | Status: AC | PRN
  Administered 2023-09-12: 32 via INTRAVENOUS

## 2023-09-12 NOTE — Progress Notes (Addendum)
Patient ID: Jodi Young                 DOB: 09/10/1971                      MRN: [id number]     HPI: Jodi Young is a 51 y.o. female referred by Dr. Izora Ribas to pharmacy clinic for HF medication management. PMH is significant for hx of short PR interval NOS, HTN, PVC and newly diagnosed HF with mildly reduced EF. Most recent LVEF 40-45%.  Started on metoprolol succinate 25mg  daily for PVC/HF 07/25/23.   At visit 08/07/23, we reviewed GDMT and the reasoning behind it. Discussed long term medication plan. Losartan and amlodipine was stopped and Entresto 49/51mg  BID was started.   At follow up 08/21/23 spironolactone 12.5mg  daily was added. Patient had a hard time cutting, so she was advised to take the whole tablet 25mg  daily.   Patient presents today for follow-up.  She reports home blood pressures 126 over 80s.  Occasional diastolic in the high 70s but mostly in the 80s.  1 episode of dizziness when she got up to go to the bathroom in the middle of the night.  Otherwise denies any dizziness, headache, blurred vision, shortness of breath, swelling.  No significant swelling seen on exam.  Reports resting heart rates about low 50s but exertional heart rates closer to 90s.  Current CHF meds: metoprolol succinate 25mg  daily, Entresto 49/51mg  twice a day, spironolactone 25mg  daily Previously tried:  Adherence Assessment  Do you ever forget to take your medication? [] Yes [x] No  Do you ever skip doses due to side effects? [] Yes [x] No  Do you have trouble affording your medicines? [] Yes [x] No  Are you ever unable to pick up your medication due to transportation difficulties? [] Yes [x] No  Do you ever stop taking your medications because you don't believe they are helping? [] Yes [x] No  Do you check your weight daily? [] Yes [x] No    BP goal: <130/80  Family History:  Family History  Problem Relation Age of Onset   Colon polyps Mother    Hypertension Father    Hypertension  Sister    Alcohol abuse Maternal Grandmother    Alcohol abuse Maternal Grandfather    Alcohol abuse Paternal Grandmother    Diabetes Paternal Grandmother    COPD Paternal Grandmother    Heart failure Paternal Grandmother    Alcohol abuse Paternal Grandfather    Colon cancer Maternal Aunt    Colon cancer Maternal Uncle    Cancer Maternal Uncle        unsure primary   Leukemia Maternal Uncle 45   Cancer Cousin        unsure   Coronary artery disease Neg Hx    Stroke Neg Hx    Esophageal cancer Neg Hx    Stomach cancer Neg Hx    Rectal cancer Neg Hx      Social History:  Social History   Socioeconomic History   Marital status: Married    Spouse name: Not on file   Number of children: Not on file   Years of education: Not on file   Highest education level: Not on file  Occupational History   Not on file  Tobacco Use   Smoking status: Never   Smokeless tobacco: Never  Substance and Sexual Activity   Alcohol use: Yes    Comment: Rarely   Drug use: No   Sexual activity: Not on file  Other Topics Concern   Not on file  Social History Narrative   Caffeine: 2 cups decaf coffee, soda, tea/day   Lives with husband, 2 children (2004, 2011)   Occupation: was Nurse, mental health - community health education, 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  Some walking, recently joined gym   Diet: good water daily, vegetables daily   Social Determinants of Health   Financial Resource Strain: Not on file  Food Insecurity: Not on file  Transportation Needs: Not on file  Physical Activity: Not on file  Stress: Not on file  Social Connections: Not on file  Intimate Partner Violence: Not on file    Diet: not discussed at this visit   Exercise: not dicussed at this visit  Home BP readings: 127/80 HR 59 mainly 120's/80 108/76, 113/80, 110/81, 104/71  Wt Readings from Last 3 Encounters:  09/12/23 162 lb (73.5 kg)  06/26/23  162 lb (73.5 kg)  04/18/23 160 lb 8 oz (72.8 kg)   BP Readings from Last 3 Encounters:  08/21/23 120/86  08/07/23 122/70  06/26/23 122/70   Pulse Readings from Last 3 Encounters:  08/21/23 62  08/07/23 (!) 53  06/26/23 86    Renal function: CrCl cannot be calculated (Patient's most recent lab result is older than the maximum 21 days allowed.).  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 120 mL 0   metoprolol succinate (TOPROL XL) 25 MG 24 hr tablet Take 1 tablet (25 mg total) by mouth daily. 90 tablet 3   norethindrone (MICRONOR) 0.35 MG tablet Take 1 tablet (0.35 mg total) by mouth daily.     sacubitril-valsartan (ENTRESTO) 49-51 MG Take 1 tablet by mouth 2 (two) times daily. 180 tablet 3   No current facility-administered medications on file prior to visit.    Allergies  Allergen Reactions   Ibuprofen Other (See Comments)    GI upset     Assessment/Plan:  1. CHF -  HFrEF (heart failure with reduced ejection fraction) (HCC) Assessment: Blood pressure slightly above goal in clinic today Patient tolerating additional spironolactone 25 mg well Diastolic BP still elevated Needs BMP Denies any significant dizziness, swelling or shortness of breath  Plan: Check BMP today If labs are stable we will increase Entresto to 97/103 mg twice a day to help with blood pressure and optimize  to target dose Follow-up in 2 weeks in clinic Final step would be to add Jardiance or [place] Advised patient to continue to monitor blood pressure hopefully if we are able to increase Entresto this will help her diastolic   Thank you   Olene Floss, Pharm.D, BCACP, BCPS, CPP [facility] [address] [place], [place] [address]  Phone: [phone];  Fax: [phone]

## 2023-09-12 NOTE — Assessment & Plan Note (Signed)
Assessment: Blood pressure slightly above goal in clinic today Patient tolerating additional spironolactone 25 mg well Diastolic BP still elevated Needs BMP Denies any significant dizziness, swelling or shortness of breath  Plan: Check BMP today If labs are stable we will increase Entresto to 97/103 mg twice a day to help with blood pressure and optimize to target dose Follow-up in 2 weeks in clinic Final step would be to add Jardiance or [place] Advised patient to continue to monitor blood pressure hopefully if we are able to increase Entresto this will help her diastolic

## 2023-09-12 NOTE — Patient Instructions (Addendum)
I will call you Monday with lab results We will plan to increase Entresto to 97/103mg  twice a day if labs look good Continue metoprolol succinate 25mg  daily, Entresto 49/51mg  twice a day, spironolactone 25mg  daily for now Continue checking blood pressure at home Please bring list of reading with you to your next appointment

## 2023-09-13 LAB — BASIC METABOLIC PANEL
BUN/Creatinine Ratio: 13 (ref 9–23)
BUN: 11 mg/dL (ref 6–24)
CO2: 24 mmol/L (ref 20–29)
Calcium: 9.7 mg/dL (ref 8.7–10.2)
Chloride: 101 mmol/L (ref 96–106)
Creatinine, Ser: 0.85 mg/dL (ref 0.57–1.00)
Glucose: 92 mg/dL (ref 70–99)
Potassium: 4.1 mmol/L (ref 3.5–5.2)
Sodium: 136 mmol/L (ref 134–144)
eGFR: 83 mL/min/{1.73_m2} (ref >59)

## 2023-09-15 ENCOUNTER — Other Ambulatory Visit: Payer: Self-pay | Admitting: Pharmacist

## 2023-09-15 MED ORDER — ENTRESTO 97-103 MG PO TABS: 1.0000 | ORAL_TABLET | Freq: Two times a day (BID) | ORAL | 3 refills | Status: DC

## 2023-09-25 ENCOUNTER — Encounter: Payer: Self-pay | Admitting: Pharmacist

## 2023-09-29 NOTE — Progress Notes (Addendum)
Patient ID: Jodi Young                 DOB: 09/17/1971                      MRN: [id number]     HPI: Jodi Young is a 52 y.o. female referred by Dr. Izora Young to pharmacy clinic for HF medication management. PMH is significant for hx of short PR interval NOS, HTN, PVC and newly diagnosed HF with mildly reduced EF. Most recent LVEF 40-45%.  Started on metoprolol succinate 25mg  daily for PVC/HF 07/25/23.   At visit 08/07/23, we reviewed GDMT and the reasoning behind it. Discussed long term medication plan. Losartan and amlodipine was stopped and Entresto 49/51mg  BID was started.   At follow up 08/21/23 spironolactone 12.5mg  daily was added. Patient had a hard time cutting, so she was advised to take the whole tablet 25mg  daily.   At visit 10/18, we reviewed GDMT and the reasoning behind it. Discussed new medication plan. Sherryll Burger was increased to the recommended guideline dose of 97/103 mg twice daily.    At today's visit patient presents today for follow-up.  She reports home blood pressures 88-105/71-77. Patient reports resting HR at home to be 57-124 via watch. In the clinic, the patients BP readings were 130/90 and 120/78 with a HR of 58. Patient has not experienced any dizziness or lightheadedness. She reports she still fatigued every now and then which she attributes to low HR. Patient has no swelling on exam. Due to low home BP readings, we discussed the appropriate technique on taking readings and found the patient is taking them accurately. Patient is brining home BP monitor to office later this afternoon to determine if the BP machine is the cause of the lower BP readings. Patient has not increased her Entresto dose due to miscommunication about doubling the previous dose of 49/51mg  twice daily to equal 98/102mg  twice daily.    Current CHF meds: metoprolol succinate 25mg  daily, Entresto 49/51mg  twice a day, spironolactone 25mg  daily  Adherence Assessment  Do you ever forget  to take your medication? [] Yes [x] No  Do you ever skip doses due to side effects? [] Yes [x] No  Do you have trouble affording your medicines? [] Yes [x] No  Are you ever unable to pick up your medication due to transportation difficulties? [] Yes [x] No  Do you ever stop taking your medications because you don't believe they are helping? [] Yes [x] No  Do you check your weight daily? [] Yes [x] No    BP goal: <130/80  Family History:  Family History  Problem Relation Age of Onset   Colon polyps Mother    Hypertension Father    Hypertension Sister    Alcohol abuse Maternal Grandmother    Alcohol abuse Maternal Grandfather    Alcohol abuse Paternal Grandmother    Diabetes Paternal Grandmother    COPD Paternal Grandmother    Heart failure Paternal Grandmother    Alcohol abuse Paternal Grandfather    Colon cancer Maternal Aunt    Colon cancer Maternal Uncle    Cancer Maternal Uncle        unsure primary   Leukemia Maternal Uncle 45   Cancer Cousin        unsure   Coronary artery disease Neg Hx    Stroke Neg Hx    Esophageal cancer Neg Hx    Stomach cancer Neg Hx    Rectal cancer Neg Hx      Social  History:  Social History   Socioeconomic History   Marital status: Married    Spouse name: Not on file   Number of children: Not on file   Years of education: Not on file   Highest education level: Not on file  Occupational History   Not on file  Tobacco Use   Smoking status: Never   Smokeless tobacco: Never  Substance and Sexual Activity   Alcohol use: Yes    Comment: Rarely   Drug use: No   Sexual activity: Not on file  Other Topics Concern   Not on file  Social History Narrative   Caffeine: 2 cups decaf coffee, soda, tea/day   Lives with husband, 2 children (2004, 2011)   Occupation: was Nurse, mental health - community health education, 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  Some walking, recently joined gym    Diet: good water daily, vegetables daily   Social Determinants of Health   Financial Resource Strain: Not on file  Food Insecurity: Not on file  Transportation Needs: Not on file  Physical Activity: Not on file  Stress: Not on file  Social Connections: Not on file  Intimate Partner Violence: Not on file    Diet: Patient reports eating salads when it is warmer, she reports she get enough fiber. She has cut out some red meat and mainly eats chicken and pork. She also has been eating more fruit and beans.  Exercise: Patient reported she used to walk and use an elliptical, but she has not done either since she started medications due to fatigue and the timing in the school year.  Home BP readings:  HR lows 50's 88 71  88 75  105 77  119.6364 87.27273    Wt Readings from Last 3 Encounters:  09/12/23 162 lb (73.5 kg)  06/26/23 162 lb (73.5 kg)  04/18/23 160 lb 8 oz (72.8 kg)   BP Readings from Last 3 Encounters:  09/30/23 112/70  08/21/23 120/86  08/07/23 122/70   Pulse Readings from Last 3 Encounters:  09/30/23 (!) 31  08/21/23 62  08/07/23 (!) 53    Renal function: CrCl cannot be calculated (Unknown ideal weight.).  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     guaiFENesin-codeine (CHERATUSSIN AC) 100-10 MG/5ML syrup Take 5 mLs by mouth 3 (three) times daily as needed for cough (sedation precautions). 120 mL 0   norethindrone  (MICRONOR) 0.35 MG tablet Take 1 tablet (0.35 mg total) by mouth daily.     spironolactone (ALDACTONE) 25 MG tablet Take 1 tablet (25 mg total) by mouth daily. 90 tablet 3   No current facility-administered medications on file prior to visit.    Allergies  Allergen Reactions   Ibuprofen Other (See Comments)    GI upset      Assessment/Plan:  1. CHF -  HFrEF (heart failure with reduced ejection fraction) (HCC) Assessment: Patient tolerating Entresto 49/51mg   She had a few days of low blood pressures (systolic 80's) BP today in clinic 112/70 (home BP cuff read 107/72) HR at at home in the low 50's with a few in the 40's One episode of HR in the 40's mid day, felt fatigued and had a headache. Had to lie down. Denies any significant dizziness, swelling or shortness of breath  Patients home BP machine appeared to be within the limits for accuracy.  Counseled patient on Farxiga and s/sx to look out for while taking.    Plan: Due to low HR decrease metoprolol to 12.5mg  daily  Continue Entrestro 49/51mg  twice daily for now Advised patient to continue to monitor blood pressure and heart rate. In 1 week patient will send readings via MyChart. If BP dropping to <100 systolic I will decrease Entresto dose Patient is to call if her diastolic BP drops below 100 Follow-up in 1 week via mychart to determine if Sherryll Burger can remain at 49/51 or needs to be decreased.   Thank you   Minette Brine, Student Pharmacist  Olene Floss, Pharm.D, BCACP, BCPS, CPP  [facility] [address], [place], [place] [address]  Phone: [phone]; Fax: [phone]

## 2023-09-30 ENCOUNTER — Ambulatory Visit: Payer: Federal, State, Local not specified - PPO | Attending: Cardiovascular Disease | Admitting: Pharmacist

## 2023-09-30 VITALS — BP 112/70 | HR 31

## 2023-09-30 DIAGNOSIS — I10 Essential (primary) hypertension: Secondary | ICD-10-CM

## 2023-09-30 DIAGNOSIS — I493 Ventricular premature depolarization: Secondary | ICD-10-CM

## 2023-09-30 DIAGNOSIS — I5020 Unspecified systolic (congestive) heart failure: Secondary | ICD-10-CM | POA: Diagnosis not present

## 2023-09-30 MED ORDER — METOPROLOL SUCCINATE ER 25 MG PO TB24
12.5000 mg | ORAL_TABLET | Freq: Every day | ORAL | Status: DC
Start: 2023-09-30 — End: 2023-11-10

## 2023-09-30 NOTE — Patient Instructions (Addendum)
Please decrease metoprolol to 12.5mg  daily (cut your 25mg  tablet in half)  Continue Entresto 49/51mg  twice a day and spironolactone 25mg  daily  Please check your blood pressure daily. Please send me readings in about a week.  If blood pressure continues to drop to <100 on top please let me know

## 2023-09-30 NOTE — Assessment & Plan Note (Signed)
Assessment: Patient tolerating Entresto 49/51mg   She had a few days of low blood pressures (systolic 80's) BP today in clinic 112/70 (home BP cuff read 107/72) HR at at home in the low 50's with a few in the 40's One episode of HR in the 40's mid day, felt fatigued and had a headache. Had to lie down. Denies any significant dizziness, swelling or shortness of breath  Patients home BP machine appeared to be within the limits for accuracy.  Counseled patient on Farxiga and s/sx to look out for while taking.    Plan: Due to low HR decrease metoprolol to 12.5mg  daily  Continue Entrestro 49/51mg  twice daily for now Advised patient to continue to monitor blood pressure and heart rate. In 1 week patient will send readings via MyChart. If BP dropping to <100 systolic I will decrease Entresto dose Patient is to call if her diastolic BP drops below 100 Follow-up in 1 week via mychart to determine if Jodi Young can remain at 49/51 or needs to be decreased.

## 2023-10-01 ENCOUNTER — Encounter (HOSPITAL_COMMUNITY): Payer: Self-pay

## 2023-10-02 ENCOUNTER — Telehealth (HOSPITAL_COMMUNITY): Payer: Self-pay | Admitting: *Deleted

## 2023-10-02 NOTE — Telephone Encounter (Signed)
Reaching out to patient to offer assistance regarding upcoming cardiac imaging study; pt verbalizes understanding of appt date/time, parking situation and where to check in, pre-test NPO status and medications ordered, and verified current allergies; name and call back number provided for further questions should they arise Johney Frame RN Navigator Cardiac Imaging [facility] [phone] office [phone] cell  Patient reports dental fillings, reports mild claustrophobia but states she will be okay covering her eyes.

## 2023-10-03 ENCOUNTER — Other Ambulatory Visit: Payer: Self-pay | Admitting: Internal Medicine

## 2023-10-03 ENCOUNTER — Ambulatory Visit (HOSPITAL_COMMUNITY)
Admission: RE | Admit: 2023-10-03 | Discharge: 2023-10-03 | Disposition: A | Discharge status: Home / Self Care | Payer: Federal, State, Local not specified - PPO | Source: Ambulatory Visit | Attending: Internal Medicine | Admitting: Internal Medicine

## 2023-10-03 DIAGNOSIS — I340 Nonrheumatic mitral (valve) insufficiency: Secondary | ICD-10-CM | POA: Diagnosis not present

## 2023-10-03 MED ORDER — GADOBUTROL 1 MMOL/ML IV SOLN
10.0000 mL | Freq: Once | INTRAVENOUS | Status: AC | PRN
  Administered 2023-10-03: 10 mL via INTRAVENOUS

## 2023-10-07 ENCOUNTER — Ambulatory Visit: Payer: Federal, State, Local not specified - PPO | Attending: Internal Medicine

## 2023-10-07 ENCOUNTER — Telehealth: Payer: Self-pay | Admitting: Internal Medicine

## 2023-10-07 DIAGNOSIS — I5020 Unspecified systolic (congestive) heart failure: Secondary | ICD-10-CM

## 2023-10-07 DIAGNOSIS — I493 Ventricular premature depolarization: Secondary | ICD-10-CM

## 2023-10-07 MED ORDER — EMPAGLIFLOZIN 10 MG PO TABS: 10.0000 mg | ORAL_TABLET | Freq: Every day | ORAL | Status: DC

## 2023-10-07 MED ORDER — EMPAGLIFLOZIN 10 MG PO TABS: 10.0000 mg | ORAL_TABLET | Freq: Every day | ORAL | 3 refills | Status: DC

## 2023-10-07 NOTE — Telephone Encounter (Signed)
-----   Message from Christell Constant sent at 10/03/2023  6:23 PM EST ----- Results: LVEF unchanged, LV is dilated Mild MR Plan: Jardiance 10 mg, one week non live ziopatch, and follow up with me to discuss results  Christell Constant, MD

## 2023-10-07 NOTE — Progress Notes (Unsigned)
Enrolled for [employer] to mail a ZIO XT long term holter monitor to the patients address on file.  

## 2023-10-07 NOTE — Telephone Encounter (Signed)
Patient is returning phone call in regards to Cardiac Morph results. 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 Please advise.

## 2023-10-07 NOTE — Telephone Encounter (Signed)
The patient has been notified of the result and verbalized understanding.  All questions (if any) were answered. Macie Burows, RN 10/07/2023 4:06 PM  Advised pt that I will leave 28 days worth of samples, a 30 day free trial offer and a $10 co pay card at the front desk for pick up.  Pt will have BMP in 7-10 days orders placed and released for draw.  Reviewed instructions for heart monitor.  Advised to call our office with questions and or concerns. Will place instructions on my chart for review.

## 2023-10-10 ENCOUNTER — Other Ambulatory Visit (HOSPITAL_COMMUNITY): Payer: Self-pay

## 2023-10-10 ENCOUNTER — Telehealth: Payer: Self-pay | Admitting: Pharmacy Technician

## 2023-10-10 NOTE — Telephone Encounter (Signed)
Pharmacy Patient Advocate Encounter   Received notification from CoverMyMeds that prior authorization for jardiance is required/requested.   Insurance verification completed.   The patient is insured through [facility] .   Per test claim: PA required; PA submitted to above mentioned insurance via CoverMyMeds Key/confirmation #/EOC [employer] Status is pending

## 2023-10-10 NOTE — Telephone Encounter (Signed)
Pharmacy Patient Advocate Encounter  Received notification from [facility] that Prior Authorization for jardiance has been APPROVED from 10/10/23 to 10/09/24. Ran test claim, Copay is $29.99-  3 months. This test claim was processed through [facility]- copay amounts may vary at other pharmacies due to pharmacy/plan contracts, or as the patient moves through the different stages of their insurance plan.   PA #/Case ID/Reference #: [id number]

## 2023-10-12 DIAGNOSIS — I493 Ventricular premature depolarization: Secondary | ICD-10-CM | POA: Diagnosis not present

## 2023-10-13 ENCOUNTER — Encounter: Payer: Self-pay | Admitting: Pharmacist

## 2023-10-14 MED ORDER — ENTRESTO 24-26 MG PO TABS: 1.0000 | ORAL_TABLET | Freq: Two times a day (BID) | ORAL | 3 refills | Status: DC

## 2023-11-01 ENCOUNTER — Other Ambulatory Visit: Payer: Self-pay | Admitting: Family Medicine

## 2023-11-01 DIAGNOSIS — I5020 Unspecified systolic (congestive) heart failure: Secondary | ICD-10-CM

## 2023-11-01 DIAGNOSIS — E559 Vitamin D deficiency, unspecified: Secondary | ICD-10-CM

## 2023-11-01 DIAGNOSIS — D509 Iron deficiency anemia, unspecified: Secondary | ICD-10-CM

## 2023-11-09 ENCOUNTER — Encounter: Payer: Self-pay | Admitting: Internal Medicine

## 2023-11-09 DIAGNOSIS — I493 Ventricular premature depolarization: Secondary | ICD-10-CM

## 2023-11-10 ENCOUNTER — Other Ambulatory Visit (INDEPENDENT_AMBULATORY_CARE_PROVIDER_SITE_OTHER): Payer: Federal, State, Local not specified - PPO

## 2023-11-10 DIAGNOSIS — E559 Vitamin D deficiency, unspecified: Secondary | ICD-10-CM | POA: Diagnosis not present

## 2023-11-10 DIAGNOSIS — D509 Iron deficiency anemia, unspecified: Secondary | ICD-10-CM

## 2023-11-10 DIAGNOSIS — I5020 Unspecified systolic (congestive) heart failure: Secondary | ICD-10-CM | POA: Diagnosis not present

## 2023-11-10 LAB — COMPREHENSIVE METABOLIC PANEL
ALT: 14 U/L (ref 0–35)
AST: 13 U/L (ref 0–37)
Albumin: 4.2 g/dL (ref 3.5–5.2)
Alkaline Phosphatase: 76 U/L (ref 39–117)
BUN: 12 mg/dL (ref 6–23)
CO2: 27 meq/L (ref 19–32)
Calcium: 9.1 mg/dL (ref 8.4–10.5)
Chloride: 103 meq/L (ref 96–112)
Creatinine, Ser: 0.89 mg/dL (ref 0.40–1.20)
GFR: 74.82 mL/min (ref >60.00)
Glucose, Bld: 99 mg/dL (ref 70–99)
Potassium: 4.1 meq/L (ref 3.5–5.1)
Sodium: 137 meq/L (ref 135–145)
Total Bilirubin: 0.6 mg/dL (ref 0.2–1.2)
Total Protein: 7.1 g/dL (ref 6.0–8.3)

## 2023-11-10 LAB — LIPID PANEL
Cholesterol: 164 mg/dL (ref 0–200)
HDL: 48.5 mg/dL (ref >39.00)
LDL Cholesterol: 102 mg/dL — ABNORMAL HIGH (ref 0–99)
NonHDL: 115.88
Total CHOL/HDL Ratio: 3
Triglycerides: 71 mg/dL (ref 0.0–149.0)
VLDL: 14.2 mg/dL (ref 0.0–40.0)

## 2023-11-10 LAB — CBC WITH DIFFERENTIAL/PLATELET
Basophils Absolute: 0 10*3/uL (ref 0.0–0.1)
Basophils Relative: 0.9 % (ref 0.0–3.0)
Eosinophils Absolute: 0.1 10*3/uL (ref 0.0–0.7)
Eosinophils Relative: 1.2 % (ref 0.0–5.0)
HCT: 40.7 % (ref 36.0–46.0)
Hemoglobin: 13.1 g/dL (ref 12.0–15.0)
Lymphocytes Relative: 38.9 % (ref 12.0–46.0)
Lymphs Abs: 2.1 10*3/uL (ref 0.7–4.0)
MCHC: 32.3 g/dL (ref 30.0–36.0)
MCV: 84.1 fL (ref 78.0–100.0)
Monocytes Absolute: 0.4 10*3/uL (ref 0.1–1.0)
Monocytes Relative: 7.4 % (ref 3.0–12.0)
Neutro Abs: 2.8 10*3/uL (ref 1.4–7.7)
Neutrophils Relative %: 51.6 % (ref 43.0–77.0)
Platelets: 239 10*3/uL (ref 150.0–400.0)
RBC: 4.84 Mil/uL (ref 3.87–5.11)
RDW: 13.7 % (ref 11.5–15.5)
WBC: 5.5 10*3/uL (ref 4.0–10.5)

## 2023-11-10 LAB — IBC PANEL
Iron: 69 ug/dL (ref 42–145)
Saturation Ratios: 18.1 % — ABNORMAL LOW (ref 20.0–50.0)
TIBC: 380.8 ug/dL (ref 250.0–450.0)
Transferrin: 272 mg/dL (ref 212.0–360.0)

## 2023-11-10 LAB — VITAMIN D 25 HYDROXY (VIT D DEFICIENCY, FRACTURES): VITD: 24.59 ng/mL — ABNORMAL LOW (ref 30.00–100.00)

## 2023-11-10 LAB — TSH: TSH: 1.56 u[IU]/mL (ref 0.35–5.50)

## 2023-11-10 LAB — FERRITIN: Ferritin: 36.9 ng/mL (ref 10.0–291.0)

## 2023-11-10 MED ORDER — METOPROLOL SUCCINATE ER 25 MG PO TB24: 25.0000 mg | ORAL_TABLET | Freq: Every day | ORAL | 3 refills | Status: DC

## 2023-11-11 NOTE — Telephone Encounter (Signed)
Called pt to verify medication dose for Entresto and metoprolol.  Reports takes entresto 24/26 mg PO BID and metoprolol 12.5 mg PO QD.  Also, previously reports full dose of metoprolol (25 mg) felt really tired. Reports BP today was 100/76-55.   I have asked pt to send in a BP log.  With BP mentioned not advisable to increase metoprolol dose. Pt will send in a my chart message with BP readings.

## 2023-11-17 ENCOUNTER — Encounter: Payer: Self-pay | Admitting: Family Medicine

## 2023-11-17 ENCOUNTER — Ambulatory Visit (INDEPENDENT_AMBULATORY_CARE_PROVIDER_SITE_OTHER): Payer: Federal, State, Local not specified - PPO | Admitting: Family Medicine

## 2023-11-17 VITALS — BP 120/72 | HR 99 | Temp 97.8°F | Ht <= 58 in | Wt 165.1 lb

## 2023-11-17 DIAGNOSIS — E66811 Obesity, class 1: Secondary | ICD-10-CM

## 2023-11-17 DIAGNOSIS — Z531 Procedure and treatment not carried out because of patient's decision for reasons of belief and group pressure: Secondary | ICD-10-CM

## 2023-11-17 DIAGNOSIS — R9431 Abnormal electrocardiogram [ECG] [EKG]: Secondary | ICD-10-CM

## 2023-11-17 DIAGNOSIS — Z0000 Encounter for general adult medical examination without abnormal findings: Secondary | ICD-10-CM

## 2023-11-17 DIAGNOSIS — E559 Vitamin D deficiency, unspecified: Secondary | ICD-10-CM

## 2023-11-17 DIAGNOSIS — Z7189 Other specified counseling: Secondary | ICD-10-CM | POA: Diagnosis not present

## 2023-11-17 DIAGNOSIS — I493 Ventricular premature depolarization: Secondary | ICD-10-CM

## 2023-11-17 DIAGNOSIS — I5020 Unspecified systolic (congestive) heart failure: Secondary | ICD-10-CM

## 2023-11-17 DIAGNOSIS — I10 Essential (primary) hypertension: Secondary | ICD-10-CM

## 2023-11-17 NOTE — Assessment & Plan Note (Signed)
Advanced directive received, scanned into chart 08/2019. HCPOA are Jodi Young then Jodi Young and Jodi Young. Does NOT want transfusion of whole blood, red cells, white cells, platelets or plasma under any circumstance.

## 2023-11-17 NOTE — Patient Instructions (Addendum)
We will request records from latest mammogram [facility] 06/2023).  Start vitamin D3 1000 units daily.  Good to see you today Return as needed or in 1 year for next physical

## 2023-11-17 NOTE — Assessment & Plan Note (Signed)
Frequent (24% on latest heart monitor) Pending EP eval.

## 2023-11-17 NOTE — Progress Notes (Addendum)
Ph: [phone] Fax: [phone]   Patient ID: Jodi Young, female    DOB: [birth year], 52 y.o.   MRN: [id number]  This visit was conducted in person.  BP 120/72   Pulse 99   Temp 97.8 F (36.6 C) (Oral)   Ht 4' 9.5" (1.461 m)   Wt 165 lb 2 oz (74.9 kg)   SpO2 (!) 80%   BMI 35.11 kg/m    CC: CPE Subjective:   HPI: Jodi Young is a 52 y.o. 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   No recent menstrual migraines. Notes improvement in migraines since she started drinking carbonated mineral water.   Preventative: Colonoscopy 06/2020 WNL, rpt 5 yrs given fmhx Russella Dar) Well woman with OBGYN Dr. Cherly Hensen yearly, last seen 04/2023 Mammogram -normal at @ [facility] 06/2023 - will request records  LMP 07/16/2023 Likely perimenopausal  Birth control: s/p BTL Lung cancer screening - not eliglbe  Flu shot yearly COVID vaccine - Pfizer 01/2020 x2, bivalent booster 08/2021 (will send [place] date) Tdap 2013, 2023 Shingrix - 05/2022, 09/2022 Advanced directive received, scanned into chart 08/2019. HCPOA are Juan Quam then Tasia Catchings and Newt Lukes. Does NOT want transfusion of whole blood, red cells, white cells, platelets or plasma under any circumstance.  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 Interim medical history since our last visit reviewed. Allergies and medications reviewed and updated. Outpatient Medications Prior to Visit  Medication Sig Dispense Refill   empagliflozin (JARDIANCE) 10 MG TABS tablet Take 1 tablet (10 mg total) by mouth daily before breakfast. 90 tablet 3   sacubitril-valsartan (ENTRESTO) 24-26 MG Take 1 tablet by mouth 2 (two) times daily. 180 tablet 3   spironolactone (ALDACTONE) 25 MG tablet Take 1 tablet (25 mg total) by mouth daily. 90 tablet 3   empagliflozin (JARDIANCE) 10 MG TABS tablet Take 1 tablet (10 mg total) by mouth daily before breakfast.     metoprolol succinate (TOPROL XL) 25 MG 24 hr tablet Take 1 tablet (25 mg total) by mouth daily. 90 tablet 3   metoprolol succinate (TOPROL XL) 25 MG 24 hr tablet Take 0.5 tablets (12.5 mg total) by mouth daily.     guaiFENesin-codeine (CHERATUSSIN AC) 100-10 MG/5ML syrup Take 5 mLs by mouth 3 (three) times daily as needed for cough (sedation precautions). 120 mL 0   norethindrone (MICRONOR) 0.35 MG tablet Take 1 tablet (0.35 mg total) by mouth daily.     No facility-administered medications prior to visit.     Per HPI unless specifically indicated in ROS section below Review of Systems  Constitutional:  Negative for activity change, appetite change, chills, fatigue, fever and unexpected weight change.  HENT:  Negative for hearing  loss.   Eyes:  Negative for visual disturbance.  Respiratory:  Negative for cough, chest tightness, shortness of breath and wheezing.   Cardiovascular:  Positive for leg swelling (in evenings). Negative for chest pain and palpitations.  Gastrointestinal:  Negative for abdominal distention, abdominal pain, blood in stool, constipation, diarrhea, nausea and vomiting.  Genitourinary:  Negative for difficulty urinating and hematuria.  Musculoskeletal:   Negative for arthralgias, myalgias and neck pain.  Skin:  Negative for rash.  Neurological:  Negative for dizziness, seizures, syncope and headaches.  Hematological:  Negative for adenopathy. Does not bruise/bleed easily.  Psychiatric/Behavioral:  Negative for dysphoric mood. The patient is not nervous/anxious.     Objective:  BP 120/72   Pulse 99   Temp 97.8 F (36.6 C) (Oral)   Ht 4' 9.5" (1.461 m)   Wt 165 lb 2 oz (74.9 kg)   SpO2 (!) 80%   BMI 35.11 kg/m   Wt Readings from Last 3 Encounters:  11/17/23 165 lb 2 oz (74.9 kg)  09/12/23 162 lb (73.5 kg)  06/26/23 162 lb (73.5 kg)      Physical Exam Vitals and nursing note reviewed.  Constitutional:      Appearance: Normal appearance. She is not ill-appearing.  HENT:     Head: Normocephalic and atraumatic.     Right Ear: Tympanic membrane, ear canal and external ear normal. There is no impacted cerumen.     Left Ear: Tympanic membrane, ear canal and external ear normal. There is no impacted cerumen.     Mouth/Throat:     Mouth: Mucous membranes are moist.     Pharynx: Oropharynx is clear. No oropharyngeal exudate or posterior oropharyngeal erythema.  Eyes:     General:        Right eye: No discharge.        Left eye: No discharge.     Extraocular Movements: Extraocular movements intact.     Conjunctiva/sclera: Conjunctivae normal.     Pupils: Pupils are equal, round, and reactive to light.  Neck:     Thyroid: No thyroid mass or thyromegaly.     Vascular: No carotid bruit.  Cardiovascular:     Rate and Rhythm: Normal rate and regular rhythm. Frequent Extrasystoles are present.    Pulses: Normal pulses.     Heart sounds: Normal heart sounds. No murmur heard. Pulmonary:     Effort: Pulmonary effort is normal. No respiratory distress.     Breath sounds: Normal breath sounds. No wheezing, rhonchi or rales.  Abdominal:     General: Bowel sounds are normal. There is no distension.     Palpations: Abdomen is soft. There is  no mass.     Tenderness: There is no abdominal tenderness. There is no guarding or rebound.     Hernia: No hernia is present.  Musculoskeletal:     Cervical back: Normal range of motion and neck supple. No rigidity.     Right lower leg: No edema.     Left lower leg: No edema.  Lymphadenopathy:     Cervical: No cervical adenopathy.  Skin:    General: Skin is warm and dry.     Findings: No rash.  Neurological:     General: No focal deficit present.     Mental Status: She is alert. Mental status is at baseline.  Psychiatric:        Mood and Affect: Mood normal.        Behavior: Behavior normal.  Results for orders placed or performed in visit on 11/10/23  TSH   Collection Time: 11/10/23  7:31 AM  Result Value Ref Range   TSH 1.56 0.35 - 5.50 uIU/mL  Comprehensive metabolic panel   Collection Time: 11/10/23  7:31 AM  Result Value Ref Range   Sodium 137 135 - 145 mEq/L   Potassium 4.1 3.5 - 5.1 mEq/L   Chloride 103 96 - 112 mEq/L   CO2 27 19 - 32 mEq/L   Glucose, Bld 99 70 - 99 mg/dL   BUN 12 6 - 23 mg/dL   Creatinine, Ser 1.61 0.40 - 1.20 mg/dL   Total Bilirubin 0.6 0.2 - 1.2 mg/dL   Alkaline Phosphatase 76 39 - 117 U/L   AST 13 0 - 37 U/L   ALT 14 0 - 35 U/L   Total Protein 7.1 6.0 - 8.3 g/dL   Albumin 4.2 3.5 - 5.2 g/dL   GFR 09.60 >45.40 mL/min   Calcium 9.1 8.4 - 10.5 mg/dL  Lipid panel   Collection Time: 11/10/23  7:31 AM  Result Value Ref Range   Cholesterol 164 0 - 200 mg/dL   Triglycerides 98.1 0.0 - 149.0 mg/dL   HDL 19.14 >78.29 mg/dL   VLDL 56.2 0.0 - 13.0 mg/dL   LDL Cholesterol 865 (H) 0 - 99 mg/dL   Total CHOL/HDL Ratio 3    NonHDL 115.88   CBC with Differential/Platelet   Collection Time: 11/10/23  7:31 AM  Result Value Ref Range   WBC 5.5 4.0 - 10.5 K/uL   RBC 4.84 3.87 - 5.11 Mil/uL   Hemoglobin 13.1 12.0 - 15.0 g/dL   HCT 78.4 69.6 - 29.5 %   MCV 84.1 78.0 - 100.0 fl   MCHC 32.3 30.0 - 36.0 g/dL   RDW 28.4 13.2 - 44.0 %   Platelets  239.0 150.0 - 400.0 K/uL   Neutrophils Relative % 51.6 43.0 - 77.0 %   Lymphocytes Relative 38.9 12.0 - 46.0 %   Monocytes Relative 7.4 3.0 - 12.0 %   Eosinophils Relative 1.2 0.0 - 5.0 %   Basophils Relative 0.9 0.0 - 3.0 %   Neutro Abs 2.8 1.4 - 7.7 K/uL   Lymphs Abs 2.1 0.7 - 4.0 K/uL   Monocytes Absolute 0.4 0.1 - 1.0 K/uL   Eosinophils Absolute 0.1 0.0 - 0.7 K/uL   Basophils Absolute 0.0 0.0 - 0.1 K/uL  Ferritin   Collection Time: 11/10/23  7:31 AM  Result Value Ref Range   Ferritin 36.9 10.0 - 291.0 ng/mL  IBC panel   Collection Time: 11/10/23  7:31 AM  Result Value Ref Range   Iron 69 42 - 145 ug/dL   Transferrin 102.7 253.6 - 360.0 mg/dL   Saturation Ratios 64.4 (L) 20.0 - 50.0 %   TIBC 380.8 250.0 - 450.0 mcg/dL  VITAMIN D 25 Hydroxy (Vit-D Deficiency, Fractures)   Collection Time: 11/10/23  7:31 AM  Result Value Ref Range   VITD 24.59 (L) 30.00 - 100.00 ng/mL   No results found for: "VITAMINB12"   Assessment & Young:   Problem List Items Addressed This Visit     Healthcare maintenance - Primary (Chronic)   Preventative protocols reviewed and updated unless pt declined. Discussed healthy diet and lifestyle.       Advanced care planning/counseling discussion (Chronic)   Advanced directive received, scanned into chart 08/2019. HCPOA are Juan Quam then Tasia Catchings and Newt Lukes. Does NOT want transfusion of whole blood, red cells, white  cells, platelets or plasma under any circumstance.       No transfusions per religious beliefs (Chronic)   Vitamin D deficiency   Rec start vit D 1000 international units  daily .      Hypertension   Chronic, stable on current regimen- see below.       Relevant Medications   metoprolol succinate (TOPROL XL) 25 MG 24 hr tablet   Obesity, Class I, BMI 30-34.9   Encouraged healthy diet and lifestyle choices to affect sustainable weight loss       Shortened PR interval   Pending EP appt       PVC's (premature  ventricular contractions)   Frequent (24% on latest heart monitor) Pending EP eval.       Relevant Medications   metoprolol succinate (TOPROL XL) 25 MG 24 hr tablet   HFrEF (heart failure with reduced ejection fraction) (HCC)   Presumed post-viral dilated CM related.  Latest EF 43%.  Now on GDMT (entresto, jardiance, spironolactone and metoprol succinate).  Pending EP for further evaluation.       Relevant Medications   metoprolol succinate (TOPROL XL) 25 MG 24 hr tablet     No orders of the defined types were placed in this encounter.   No orders of the defined types were placed in this encounter.   Patient Instructions  We will request records from latest mammogram [facility] 06/2023).  Start vitamin D3 1000 units daily.  Good to see you today Return as needed or in 1 year for next physical   Follow up Young: Return in about 1 year (around 11/16/2024), or if symptoms worsen or fail to improve, for annual exam, prior fasting for blood work.  Eustaquio Boyden, MD

## 2023-11-17 NOTE — Assessment & Plan Note (Signed)
Chronic, stable on current regimen- see below.

## 2023-11-17 NOTE — Assessment & Plan Note (Signed)
Rec start vit D 1000 international units  daily.

## 2023-11-17 NOTE — Assessment & Plan Note (Signed)
Encouraged healthy diet and lifestyle choices to affect sustainable weight loss.  ?

## 2023-11-17 NOTE — Assessment & Plan Note (Signed)
Presumed post-viral dilated CM related.  Latest EF 43%.  Now on GDMT (entresto, jardiance, spironolactone and metoprol succinate).  Pending EP for further evaluation.

## 2023-11-17 NOTE — Assessment & Plan Note (Signed)
Pending EP appt

## 2023-11-17 NOTE — Assessment & Plan Note (Signed)
Preventative protocols reviewed and updated unless pt declined. Discussed healthy diet and lifestyle.  

## 2023-12-09 ENCOUNTER — Institutional Professional Consult (permissible substitution): Payer: Federal, State, Local not specified - PPO | Admitting: Cardiology

## 2023-12-23 ENCOUNTER — Encounter: Payer: Self-pay | Admitting: Cardiology

## 2023-12-23 ENCOUNTER — Ambulatory Visit: Payer: Federal, State, Local not specified - PPO | Attending: Cardiology | Admitting: Cardiology

## 2023-12-23 ENCOUNTER — Other Ambulatory Visit: Payer: Self-pay

## 2023-12-23 VITALS — BP 110/68 | HR 96 | Ht <= 58 in | Wt 168.3 lb

## 2023-12-23 DIAGNOSIS — I493 Ventricular premature depolarization: Secondary | ICD-10-CM

## 2023-12-23 DIAGNOSIS — I5022 Chronic systolic (congestive) heart failure: Secondary | ICD-10-CM

## 2023-12-23 DIAGNOSIS — I428 Other cardiomyopathies: Secondary | ICD-10-CM

## 2023-12-23 NOTE — Patient Instructions (Addendum)
Medication Instructions:  Your physician recommends that you continue on your current medications as directed. Please refer to the Current Medication list given to you today.  *If you need a refill on your cardiac medications before your next appointment, please call your pharmacy*  Lab Work: BMET and CBC prior to your ablation - you may come to the [facility] office or any [facility] location to have these draw. Please go anytime after March 14th.   Testing/Procedures: Ablation  Your physician has recommended that you have an ablation. Catheter ablation is a medical procedure used to treat some cardiac arrhythmias (irregular heartbeats). During catheter ablation, a long, thin, flexible tube is put into a blood vessel in your groin (upper thigh), or neck. This tube is called an ablation catheter. It is then guided to your heart through the blood vessel. Radio frequency waves destroy small areas of heart tissue where abnormal heartbeats may cause an arrhythmia to start.  You are scheduled for  PVC Ablation  on Monday, April 14 with Dr. Michele Rockers.Please arrive at the Main Entrance A at [facility]: [address] [place], [place] [address] at 5:30 AM    Follow-Up: At [facility], you and your health needs are our priority.  As part of our continuing mission to provide you with exceptional heart care, we have created designated Provider Care Teams.  These Care Teams include your primary Cardiologist (physician) and Advanced Practice Providers (APPs -  Physician Assistants and Nurse Practitioners) who all work together to provide you with the care you need, when you need it.  Your next appointment:   4 weeks after your ablation   Provider:   You may see Nobie Putnam, MD or one of the following Advanced Practice Providers on your designated Care Team:   Francis Dowse, [place] [address] [place], [place] Sherie Don, NP Canary Brim, NP

## 2023-12-23 NOTE — Progress Notes (Unsigned)
Electrophysiology Office Note:   Date:  12/24/2023  ID:  Jodi Young, Jodi Young Aug 13, 1971, MRN [id number]  Primary Cardiologist: Christell Constant, MD Electrophysiologist: Nobie Putnam, MD      History of Present Illness:   Jodi Young is a 53 y.o. female with h/o HTN, PVC and newly diagnosed HF with mildly reduced EF who is being seen today for evaluation of her PVCs.   Discussed the use of AI scribe software for clinical note transcription with the patient, who gave verbal consent to proceed.  History of Present Illness   The patient, with a history of frequent premature ventricular contractions (PVCs), presents for EP consultation.  The patient does not feel the majority of the PVCs but has noticed some irregular heartbeats at time. The patient also experiences fatigue and difficulty with physical exertion, such as climbing stairs. The patient has a history of a viral infection one year ago, which she suspects may have triggered the onset of the PVCs. The patient has been on medication for the PVCs, but the medication has not been effective in stopping the PVCs. She was intolerant of higher doses of metoprolol. The patient also reports some swelling in the ankles, which does not always resolve overnight. She continues to work and is relatively well otherwise. No new or acute complaints.     Review of systems complete and found to be negative unless listed in HPI.   EP Information / Studies Reviewed:    EKG is ordered today. Personal review as below.  EKG Interpretation Date/Time:  Tuesday December 23 2023 14:55:52 EST Ventricular Rate:  96 PR Interval:  126 QRS Duration:  84 QT Interval:  360 QTC Calculation: 454 R Axis:   8  Text Interpretation: Sinus rhythm with sinus arrhythmia with frequent Premature ventricular complexes Septal infarct , age undetermined When compared with ECG of 26-Jun-2023 08:21, No significant change was found Confirmed by Nobie Putnam [phone])  on 12/24/2023 2:26:51 PM   Zio Monitor 09/2023:   Patient had a minimum heart rate of 53 bpm, maximum heart rate of 164 bpm, and average heart rate of 95 bpm.  Predominant underlying rhythm was sinus rhythm.  Five shorts runs of NSVT (longest 5 beats).  Isolated PVCs were frequent (24%).  Isolated PACs were rare (<1.0%).  Triggered and diary events associated with sinus rhythm and sinus tachycardia.   Frequent, asymptomatic PVCs.  Cardiac MRI 09/2023:  IMPRESSION: 1.  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 2. Increase in T2 signal; no clear localizing imaging findings for PVCs. 3.  Dilated left atrium with mild valve regurgitation.  Nuclear Stress 08/2023:    LV perfusion is normal. There is no evidence of ischemia. There is no evidence of infarction.   Left ventricular function is abnormal. Global function is moderately reduced. There were no regional wall motion abnormalities. Nuclear stress EF: 35%. The left ventricular ejection fraction is moderately decreased (30-44%). End diastolic cavity size is normal.   Average exercise capacity (6:00 min:s; 7.0 METS). Normal HR/BP response to exercise. Frequent PVCs noted prior to the study and in recovery.   The study is normal. The study is intermediate risk.    Echo 06/2023: 1. Left ventricular ejection fraction, by estimation, is 40 to 45%. Left  ventricular ejection fraction by 3D volume is 43 %. The left ventricle has  mildly decreased function. The left ventricle demonstrates global  hypokinesis. The left ventricular  internal cavity size was mildly dilated. Left ventricular diastolic  parameters are indeterminate.   2. Right ventricular systolic function is normal. The right ventricular  size is normal. There is normal pulmonary artery systolic pressure.   3. The mitral valve is normal in structure. Moderate mitral valve  regurgitation. No evidence of mitral stenosis.   4. Tricuspid valve regurgitation is  mild to moderate.   5. The aortic valve is normal in structure. Aortic valve regurgitation is  mild. No aortic stenosis is present.   6. The inferior vena cava is normal in size with greater than 50%  respiratory variability, suggesting right atrial pressure of 3 mmHg.    Physical Exam:   VS:  BP 110/68   Pulse 96   Ht 4\' 9"  (1.448 m)   Wt 168 lb 4.8 oz (76.3 kg)   SpO2 96%   BMI 36.42 kg/m    Wt Readings from Last 3 Encounters:  12/23/23 168 lb 4.8 oz (76.3 kg)  11/17/23 165 lb 2 oz (74.9 kg)  09/12/23 162 lb (73.5 kg)     GEN: Well nourished, well developed in no acute distress NECK: No JVD CARDIAC: Normal rate, irregular RESPIRATORY:  Clear to auscultation without rales, wheezing or rhonchi  ABDOMEN: Soft, non-distended EXTREMITIES:  No edema; No deformity   ASSESSMENT AND PLAN:   Jodi Young is a 53 y.o. female with h/o HTN, PVC and newly diagnosed HF with mildly reduced EF who is being seen today for evaluation of her PVCs.   #Frequent unifocal PVCs: LBBB morphology, V3 transition, inferior axis (indeterminate R/L axis, isoelectric in I). Likely outflow tract origin. Burden 24% on Zio monitor, likely contributing to her cardiomyopathy and HF symptoms. -Discussed treatment options today for PVCs including antiarrhythmic drug therapy and ablation. Discussed risks, recovery and likelihood of success with each treatment strategy. Risk, benefits, and alternatives to EP study and ablation for PVCs were discussed. These risks include but are not limited to stroke, bleeding, vascular damage, tamponade, perforation, damage to other nearby structures and death.  The patient understands these risks and wishes to proceed.  We will therefore proceed with catheter ablation at the next available time.  - Continue metoprolol in the interim.   #Chronic systolic heart failure: LVEF 43% on MRI. Likely PVC mediated.  #NICM:  -Continue medical management and excellent care by our HF  colleagues.   Signed, Nobie Putnam, MD

## 2024-02-12 ENCOUNTER — Telehealth (HOSPITAL_COMMUNITY): Payer: Self-pay

## 2024-02-12 NOTE — Telephone Encounter (Signed)
 Attempted to reach patient to discuss upcoming procedure, no answer. Left VM for patient to return call.

## 2024-02-16 NOTE — Telephone Encounter (Addendum)
 Spoke with patient to complete pre-procedure call.     New medical conditions? No   Recent hospitalizations or surgeries? No Started any new medications? No Patient made aware to contact office to inform of any new medications started. Any changes in activities of daily living? No  Pre-procedure testing scheduled: lab work ordered  Confirmed patient is scheduled for  PVC ablation  on Monday, April 14 with Dr. Michele Rockers. Instructed patient to arrive at the Main Entrance A at [facility]: [address] [place], [place] [address] and check in at Admitting at 5:30 AM.  Advised of plan to go home the same day and will only stay overnight if medically necessary. You MUST have a responsible adult to drive you home and MUST be with you the first 24 hours after you arrive home or your procedure could be cancelled.  All questions addressed. Patient verbalized understanding to information provided and is agreeable to proceed with procedure.

## 2024-02-26 LAB — CBC
Hematocrit: 42.4 % (ref 34.0–46.6)
Hemoglobin: 13.5 g/dL (ref 11.1–15.9)
MCH: 26.6 pg (ref 26.6–33.0)
MCHC: 31.8 g/dL (ref 31.5–35.7)
MCV: 84 fL (ref 79–97)
Platelets: 242 10*3/uL (ref 150–450)
RBC: 5.07 x10E6/uL (ref 3.77–5.28)
RDW: 13.2 % (ref 11.7–15.4)
WBC: 5.2 10*3/uL (ref 3.4–10.8)

## 2024-02-26 LAB — BASIC METABOLIC PANEL WITH GFR
BUN/Creatinine Ratio: 9 (ref 9–23)
BUN: 7 mg/dL (ref 6–24)
CO2: 22 mmol/L (ref 20–29)
Calcium: 9.3 mg/dL (ref 8.7–10.2)
Chloride: 103 mmol/L (ref 96–106)
Creatinine, Ser: 0.77 mg/dL (ref 0.57–1.00)
Glucose: 90 mg/dL (ref 70–99)
Potassium: 4.3 mmol/L (ref 3.5–5.2)
Sodium: 138 mmol/L (ref 134–144)
eGFR: 93 mL/min/{1.73_m2} (ref >59)

## 2024-03-01 ENCOUNTER — Telehealth (HOSPITAL_COMMUNITY): Payer: Self-pay

## 2024-03-01 NOTE — Telephone Encounter (Signed)
 Attempted to reach patient to discuss upcoming procedure, no answer. Left VM for patient to return call.

## 2024-03-02 NOTE — Telephone Encounter (Signed)
 Returned missed call from patient to discuss upcoming procedure.   Labs: completed.   Any recent signs of acute illness or been started on antibiotics? No Any new medications started? No Any medications to hold? Jardiance and Metoprolol 3 days  Medication instructions:  On the morning of your procedure DO NOT take any medication. or the procedure may be rescheduled. Nothing to eat or drink after midnight prior to your procedure.  Confirmed patient is scheduled for  PVC Ablation  on Monday, April 14 with Dr. Michele Rockers. Instructed patient to arrive at the Main Entrance A at [facility]: [address] [place], [place] [address] and check in at Admitting at 5:30 AM  Advised of plan to go home the same day and will only stay overnight if medically necessary. You MUST have a responsible adult to drive you home and MUST be with you the first 24 hours after you arrive home or your procedure could be cancelled.  Patient verbalized understanding to all instructions provided and agreed to proceed with procedure.

## 2024-03-03 ENCOUNTER — Telehealth: Payer: Self-pay | Admitting: Internal Medicine

## 2024-03-03 ENCOUNTER — Encounter: Payer: Self-pay | Admitting: Pharmacist

## 2024-03-03 MED ORDER — SPIRONOLACTONE 25 MG PO TABS: 25.0000 mg | ORAL_TABLET | Freq: Every day | ORAL | 3 refills | Status: DC

## 2024-03-03 NOTE — Telephone Encounter (Signed)
 Patient is calling to speak with you. But she said she will attempt to call back later on her next break. Please advise

## 2024-03-05 NOTE — Pre-Procedure Instructions (Signed)
Attempted to call patient regarding procedure instructions.   Left voicemail on the following items: Arrival time 0515 Nothing to eat or drink after midnight No meds AM of procedure Responsible person to drive you home and stay with you for 24 hrs

## 2024-03-08 ENCOUNTER — Ambulatory Visit (HOSPITAL_COMMUNITY)
Admission: RE | Disposition: A | Discharge status: Home / Self Care | Payer: Self-pay | Source: Home / Self Care | Attending: Cardiology

## 2024-03-08 ENCOUNTER — Ambulatory Visit (HOSPITAL_COMMUNITY)
Admission: RE | Admit: 2024-03-08 | Discharge: 2024-03-08 | Disposition: A | Discharge status: Home / Self Care | Payer: Federal, State, Local not specified - PPO | Attending: Cardiology | Admitting: Cardiology

## 2024-03-08 ENCOUNTER — Ambulatory Visit (HOSPITAL_COMMUNITY)

## 2024-03-08 ENCOUNTER — Other Ambulatory Visit: Payer: Self-pay

## 2024-03-08 DIAGNOSIS — I447 Left bundle-branch block, unspecified: Secondary | ICD-10-CM | POA: Insufficient documentation

## 2024-03-08 DIAGNOSIS — I5020 Unspecified systolic (congestive) heart failure: Secondary | ICD-10-CM | POA: Diagnosis not present

## 2024-03-08 DIAGNOSIS — I509 Heart failure, unspecified: Secondary | ICD-10-CM | POA: Diagnosis not present

## 2024-03-08 DIAGNOSIS — I493 Ventricular premature depolarization: Secondary | ICD-10-CM | POA: Insufficient documentation

## 2024-03-08 DIAGNOSIS — Z79899 Other long term (current) drug therapy: Secondary | ICD-10-CM | POA: Insufficient documentation

## 2024-03-08 DIAGNOSIS — I110 Hypertensive heart disease with heart failure: Secondary | ICD-10-CM | POA: Diagnosis not present

## 2024-03-08 HISTORY — PX: PVC ABLATION: EP1236

## 2024-03-08 LAB — PREGNANCY, URINE: Preg Test, Ur: NEGATIVE

## 2024-03-08 LAB — NO BLOOD PRODUCTS

## 2024-03-08 SURGERY — PVC ABLATION
Anesthesia: Monitor Anesthesia Care

## 2024-03-08 MED ORDER — SODIUM CHLORIDE 0.9 % IV SOLN: 250.0000 mL | INTRAVENOUS | Status: DC | PRN

## 2024-03-08 MED ORDER — HEPARIN SODIUM (PORCINE) 1000 UNIT/ML IJ SOLN
INTRAMUSCULAR | Status: DC | PRN
  Administered 2024-03-08: 12000 [IU] via INTRAVENOUS
  Administered 2024-03-08: 1000 [IU] via INTRAVENOUS

## 2024-03-08 MED ORDER — PROPOFOL 1000 MG/100ML IV EMUL
INTRAVENOUS | Status: AC
  Filled 2024-03-08: qty 200

## 2024-03-08 MED ORDER — HEPARIN (PORCINE) IN NACL 1000-0.9 UT/500ML-% IV SOLN
INTRAVENOUS | Status: DC | PRN
  Administered 2024-03-08 (×2): 500 mL

## 2024-03-08 MED ORDER — SODIUM CHLORIDE 0.9% FLUSH: 3.0000 mL | INTRAVENOUS | Status: DC | PRN

## 2024-03-08 MED ORDER — ASPIRIN 325 MG PO TBEC
DELAYED_RELEASE_TABLET | ORAL | Status: AC
  Administered 2024-03-08: 325 mg
  Filled 2024-03-08: qty 1

## 2024-03-08 MED ORDER — SODIUM CHLORIDE 0.9% FLUSH: 3.0000 mL | Freq: Two times a day (BID) | INTRAVENOUS | Status: DC

## 2024-03-08 MED ORDER — ONDANSETRON HCL 4 MG/2ML IJ SOLN: 4.0000 mg | Freq: Four times a day (QID) | INTRAMUSCULAR | Status: DC | PRN

## 2024-03-08 MED ORDER — ASPIRIN 325 MG PO TBEC: 325.0000 mg | DELAYED_RELEASE_TABLET | Freq: Every day | ORAL | 0 refills | Status: DC

## 2024-03-08 MED ORDER — PHENYLEPHRINE 80 MCG/ML (10ML) SYRINGE FOR IV PUSH (FOR BLOOD PRESSURE SUPPORT)
PREFILLED_SYRINGE | INTRAVENOUS | Status: DC | PRN
  Administered 2024-03-08 (×2): 80 ug via INTRAVENOUS

## 2024-03-08 MED ORDER — ASPIRIN 325 MG PO TABS: 325.0000 mg | ORAL_TABLET | Freq: Once | ORAL | Status: DC

## 2024-03-08 MED ORDER — PHENYLEPHRINE HCL-NACL 20-0.9 MG/250ML-% IV SOLN
INTRAVENOUS | Status: DC | PRN
  Administered 2024-03-08: 20 ug/min via INTRAVENOUS

## 2024-03-08 MED ORDER — PROPOFOL 500 MG/50ML IV EMUL
INTRAVENOUS | Status: DC | PRN
  Administered 2024-03-08: 50 ug/kg/min via INTRAVENOUS

## 2024-03-08 MED ORDER — ACETAMINOPHEN 325 MG PO TABS: 650.0000 mg | ORAL_TABLET | ORAL | Status: DC | PRN

## 2024-03-08 MED ORDER — FENTANYL CITRATE (PF) 250 MCG/5ML IJ SOLN
INTRAMUSCULAR | Status: DC | PRN
  Administered 2024-03-08: 50 ug via INTRAVENOUS
  Administered 2024-03-08 (×10): 25 ug via INTRAVENOUS

## 2024-03-08 MED ORDER — BUPIVACAINE HCL (PF) 0.25 % IJ SOLN
INTRAMUSCULAR | Status: AC
  Filled 2024-03-08: qty 60

## 2024-03-08 MED ORDER — HEPARIN SODIUM (PORCINE) 1000 UNIT/ML IJ SOLN
INTRAMUSCULAR | Status: DC | PRN
  Administered 2024-03-08: 1000 [IU] via INTRAVENOUS

## 2024-03-08 MED ORDER — DEXMEDETOMIDINE HCL IN NACL 80 MCG/20ML IV SOLN
INTRAVENOUS | Status: AC
  Filled 2024-03-08: qty 20

## 2024-03-08 MED ORDER — FENTANYL CITRATE (PF) 100 MCG/2ML IJ SOLN
INTRAMUSCULAR | Status: AC
  Filled 2024-03-08: qty 2

## 2024-03-08 MED ORDER — SODIUM CHLORIDE 0.9 % IV SOLN: INTRAVENOUS | Status: DC

## 2024-03-08 MED ORDER — PROTAMINE SULFATE 10 MG/ML IV SOLN
INTRAVENOUS | Status: DC | PRN
  Administered 2024-03-08: 35 mg via INTRAVENOUS

## 2024-03-08 MED ORDER — ATROPINE SULFATE 1 MG/10ML IJ SOSY
PREFILLED_SYRINGE | INTRAMUSCULAR | Status: AC
  Filled 2024-03-08: qty 10

## 2024-03-08 MED ORDER — PROPOFOL 10 MG/ML IV BOLUS
INTRAVENOUS | Status: DC | PRN
  Administered 2024-03-08: 30 mg via INTRAVENOUS
  Administered 2024-03-08: 50 mg via INTRAVENOUS

## 2024-03-08 MED ORDER — DEXMEDETOMIDINE HCL IN NACL 80 MCG/20ML IV SOLN
INTRAVENOUS | Status: DC | PRN
  Administered 2024-03-08: 8 ug via INTRAVENOUS

## 2024-03-08 MED ORDER — ONDANSETRON HCL 4 MG/2ML IJ SOLN
INTRAMUSCULAR | Status: DC | PRN
  Administered 2024-03-08: 4 mg via INTRAVENOUS

## 2024-03-08 SURGICAL SUPPLY — 15 items
BAG SNAP BAND KOVER 36X36 (MISCELLANEOUS) IMPLANT
CATH ABLAT QDOT MICRO BI TC DF (CATHETERS) IMPLANT
CATH BI DIR 7FR CS F-J 12 PIN (CATHETERS) IMPLANT
CATH GE 8FR SOUNDSTAR (CATHETERS) IMPLANT
CLOSURE PERCLOSE PROSTYLE (VASCULAR PRODUCTS) IMPLANT
DEVICE CLOSURE MYNXGRIP 6/7F (Vascular Products) IMPLANT
PACK EP LF (CUSTOM PROCEDURE TRAY) ×1 IMPLANT
PAD DEFIB RADIO PHYSIO CONN (PAD) ×1 IMPLANT
PATCH CARTO3 (PAD) IMPLANT
SHEATH INTRO SL0 8.5F 63 (SHEATH) IMPLANT
SHEATH PINNACLE 5F 10CM (SHEATH) IMPLANT
SHEATH PINNACLE 8F 10CM (SHEATH) IMPLANT
SHEATH PINNACLE 9F 10CM (SHEATH) IMPLANT
SHEATH PROBE COVER 6X72 (BAG) IMPLANT
TUBING SMART ABLATE COOLFLOW (TUBING) IMPLANT

## 2024-03-08 NOTE — Progress Notes (Signed)
Report given to Natalie, RN

## 2024-03-08 NOTE — Anesthesia Preprocedure Evaluation (Signed)
 Anesthesia Evaluation   Patient awake    Reviewed: Allergy & Precautions, NPO status , Patient's Chart, lab work & pertinent test results  History of Anesthesia Complications Negative for: history of anesthetic complications  Airway Mallampati: II  TM Distance: >3 FB Neck ROM: Full    Dental  (+) Teeth Intact, Dental Advisory Given   Pulmonary neg pulmonary ROS   breath sounds clear to auscultation       Cardiovascular hypertension, Pt. on medications and Pt. on home beta blockers + dysrhythmias  Rhythm:Regular     Neuro/Psych  Headaches    GI/Hepatic Neg liver ROS,GERD  Controlled,,  Endo/Other  negative endocrine ROS    Renal/GU negative Renal ROS     Musculoskeletal   Abdominal   Peds  Hematology negative hematology ROS (+) Lab Results      Component                Value               Date                      WBC                      5.2                 02/25/2024                HGB                      13.5                02/25/2024                HCT                      42.4                02/25/2024                MCV                      84                  02/25/2024                PLT                      242                 02/25/2024              Anesthesia Other Findings   Reproductive/Obstetrics Lab Results      Component                Value               Date                      PREGTESTUR               NEGATIVE            03/08/2024  Anesthesia Physical Anesthesia Plan  ASA: 2  Anesthesia Plan: MAC   Post-op Pain Management: Minimal or no pain anticipated   Induction: Intravenous  PONV Risk Score and Plan: 2 and Propofol infusion and Ondansetron  Airway Management Planned: Nasal Cannula, Natural Airway and Simple Face Mask  Additional Equipment: None  Intra-op Plan:   Post-operative Plan:   Informed Consent: I  have reviewed the patients History and Physical, chart, labs and discussed the procedure including the risks, benefits and alternatives for the proposed anesthesia with the patient or authorized representative who has indicated his/her understanding and acceptance.     Dental advisory given  Plan Discussed with: CRNA  Anesthesia Plan Comments:        Anesthesia Quick Evaluation

## 2024-03-08 NOTE — H&P (Signed)
 Electrophysiology Office Note:   Date:  12/24/2023  ID:  Mollie, Rossano 09-Aug-1971, MRN [id number]   Primary Cardiologist: Christell Constant, MD Electrophysiologist: Nobie Putnam, MD       History of Present Illness:   LIYANNA CARTWRIGHT is a 53 y.o. female with h/o HTN, PVC and newly diagnosed HF with mildly reduced EF who is being seen today for evaluation of her PVCs.    Discussed the use of AI scribe software for clinical note transcription with the patient, who gave verbal consent to proceed.   History of Present Illness   The patient, with a history of frequent premature ventricular contractions (PVCs), presents for EP consultation.  The patient does not feel the majority of the PVCs but has noticed some irregular heartbeats at time. The patient also experiences fatigue and difficulty with physical exertion, such as climbing stairs. The patient has a history of a viral infection one year ago, which she suspects may have triggered the onset of the PVCs. The patient has been on medication for the PVCs, but the medication has not been effective in stopping the PVCs. She was intolerant of higher doses of metoprolol. The patient also reports some swelling in the ankles, which does not always resolve overnight. She continues to work and is relatively well otherwise. No new or acute complaints.   Interval: Patient reports today for planned PVC ablation. Reports doing relatively well. No new or acute complaints.     Review of systems complete and found to be negative unless listed in HPI.    EP Information / Studies Reviewed:      EKG Interpretation Date/Time:                  Tuesday December 23 2023 14:55:52 EST Ventricular Rate:         96 PR Interval:                 126 QRS Duration:             84 QT Interval:                 360 QTC Calculation:454 R Axis:                         8   Text Interpretation:Sinus rhythm with sinus arrhythmia with frequent Premature  ventricular complexes Septal infarct , age undetermined When compared with ECG of 26-Jun-2023 08:21, No significant change was found Confirmed by Nobie Putnam [phone]) on 12/24/2023 2:26:51 PM    Zio Monitor 09/2023:   Patient had a minimum heart rate of 53 bpm, maximum heart rate of 164 bpm, and average heart rate of 95 bpm.  Predominant underlying rhythm was sinus rhythm.  Five shorts runs of NSVT (longest 5 beats).  Isolated PVCs were frequent (24%).  Isolated PACs were rare (<1.0%).  Triggered and diary events associated with sinus rhythm and sinus tachycardia.   Frequent, asymptomatic PVCs.   Cardiac MRI 09/2023:    IMPRESSION: 1.  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 2. Increase in T2 signal; no clear localizing imaging findings for PVCs. 3.  Dilated left atrium with mild valve regurgitation.   Nuclear Stress 08/2023:      LV perfusion is normal. There is no evidence of ischemia. There is no evidence of infarction.   Left ventricular function is abnormal. Global function is moderately reduced. There were no regional wall motion  abnormalities. Nuclear stress EF: 35%. The left ventricular ejection fraction is moderately decreased (30-44%). End diastolic cavity size is normal.   Average exercise capacity (6:00 min:s; 7.0 METS). Normal HR/BP response to exercise. Frequent PVCs noted prior to the study and in recovery.   The study is normal. The study is intermediate risk.    Echo 06/2023: 1. Left ventricular ejection fraction, by estimation, is 40 to 45%. Left  ventricular ejection fraction by 3D volume is 43 %. The left ventricle has  mildly decreased function. The left ventricle demonstrates global  hypokinesis. The left ventricular  internal cavity size was mildly dilated. Left ventricular diastolic  parameters are indeterminate.   2. Right ventricular systolic function is normal. The right ventricular  size is normal. There is normal pulmonary artery  systolic pressure.   3. The mitral valve is normal in structure. Moderate mitral valve  regurgitation. No evidence of mitral stenosis.   4. Tricuspid valve regurgitation is mild to moderate.   5. The aortic valve is normal in structure. Aortic valve regurgitation is  mild. No aortic stenosis is present.   6. The inferior vena cava is normal in size with greater than 50%  respiratory variability, suggesting right atrial pressure of 3 mmHg.      Physical Exam:    Today's Vitals   03/08/24 0540 03/08/24 0551  BP: (!) 140/81   Pulse: 70   Resp: 16   Temp: 98.8 F (37.1 C)   TempSrc: Oral   SpO2: 98%   Weight: 73.5 kg   Height: 4\' 10"  (1.473 m)   PainSc:  0-No pain   Body mass index is 33.86 kg/m.   GEN: Well nourished, well developed in no acute distress NECK: No JVD CARDIAC: Normal rate, irregular RESPIRATORY:  Clear to auscultation without rales, wheezing or rhonchi  ABDOMEN: Soft, non-distended EXTREMITIES:  No edema; No deformity    ASSESSMENT AND PLAN:   SHANICE POZNANSKI is a 53 y.o. female with h/o HTN, PVC and newly diagnosed HF with mildly reduced EF who is being seen today for evaluation of her PVCs.    #Frequent unifocal PVCs: LBBB morphology, V3 transition, inferior axis (indeterminate R/L axis, isoelectric in I). Likely outflow tract origin. Burden 24% on Zio monitor, likely contributing to her cardiomyopathy and HF symptoms. -Discussed treatment options today for PVCs including antiarrhythmic drug therapy and ablation. Discussed risks, recovery and likelihood of success with each treatment strategy. Risk, benefits, and alternatives to EP study and ablation for PVCs were discussed. These risks include but are not limited to stroke, bleeding, vascular damage, tamponade, perforation, damage to other nearby structures and death.  The patient understands these risks and wishes to proceed today.     Signed, Ardeen Kohler, MD

## 2024-03-08 NOTE — Discharge Instructions (Signed)

## 2024-03-08 NOTE — Transfer of Care (Signed)
 Immediate Anesthesia Transfer of Care Note  Patient: Jodi Young  Procedure(s) Performed: PVC ABLATION  Patient Location: PACU and Cath Lab  Anesthesia Type:MAC  Level of Consciousness: awake, alert , and oriented  Airway & Oxygen Therapy: Patient Spontanous Breathing and Patient connected to nasal cannula oxygen  Post-op Assessment: Report given to RN and Post -op Vital signs reviewed and stable  Post vital signs: stable  Last Vitals:  Vitals Value Taken Time  BP 113/80 03/08/24 1111  Temp    Pulse 70 03/08/24 1115  Resp 12 03/08/24 1115  SpO2 98 % 03/08/24 1115  Vitals shown include unfiled device data.  Last Pain:  Vitals:   03/08/24 0551  TempSrc:   PainSc: 0-No pain      Patients Stated Pain Goal: 4 (03/08/24 0551)  Complications: No notable events documented.

## 2024-03-09 ENCOUNTER — Telehealth (HOSPITAL_COMMUNITY): Payer: Self-pay

## 2024-03-09 ENCOUNTER — Encounter (HOSPITAL_COMMUNITY): Payer: Self-pay | Admitting: Cardiology

## 2024-03-09 LAB — POCT ACTIVATED CLOTTING TIME
Activated Clotting Time: 291 s
Activated Clotting Time: 285 s

## 2024-03-09 NOTE — Telephone Encounter (Signed)
 Attempted to reach patient to follow up with procedure completed on 03/08/24, no answer. Left VM for patient to return call.

## 2024-03-10 MED FILL — Bupivacaine HCl Preservative Free (PF) Inj 0.25%: INTRAMUSCULAR | Qty: 30 | Status: AC

## 2024-03-10 MED FILL — Atropine Sulfate Soln Prefill Syr 1 MG/10ML (0.1 MG/ML): INTRAMUSCULAR | Qty: 10 | Status: AC

## 2024-03-10 MED FILL — Fentanyl Citrate Preservative Free (PF) Inj 100 MCG/2ML: INTRAMUSCULAR | Qty: 2 | Status: AC

## 2024-03-10 NOTE — Telephone Encounter (Addendum)
 Spoke with patient to complete post procedure follow up call.  Patient removed large bandage at puncture site after 24 hours and reports no complications with groin sites.   Instructions reviewed with patient:  It is normal to have bruising, tenderness and a pea or marble sized lump/knot at the groin site which can take up to three months to resolve.  Get help right away if you notice sudden swelling at the puncture site.  Check your puncture site every day for signs of infection: fever, redness, swelling, pus drainage, warmth, foul odor or excessive pain. If this occurs, please call the office at [phone], to speak with the nurse. Get help right away if your puncture site is bleeding and the bleeding does not stop after applying firm pressure to the area.  You may continue to have skipped beats during the first several months after your procedure.  Take ASA 325 mg daily x 4 weeks as recommended by Dr. Daneil Dunker. You will follow up with the APP on 04/06/24 after your procedure.   Patient verbalized understanding to all instructions provided.

## 2024-03-12 NOTE — Anesthesia Postprocedure Evaluation (Signed)
 Anesthesia Post Note  Patient: Jodi Young  Procedure(s) Performed: PVC ABLATION     Patient location during evaluation: Cath Lab Anesthesia Type: MAC Level of consciousness: awake and alert Pain management: pain level controlled Vital Signs Assessment: post-procedure vital signs reviewed and stable Respiratory status: spontaneous breathing, nonlabored ventilation and respiratory function stable Cardiovascular status: stable and blood pressure returned to baseline Postop Assessment: no apparent nausea or vomiting Anesthetic complications: no   No notable events documented.                  Betsy Rosello

## 2024-03-28 ENCOUNTER — Other Ambulatory Visit: Payer: Self-pay | Admitting: Cardiology

## 2024-03-30 NOTE — Telephone Encounter (Signed)
 Pt pharmacy requesting refill. Rx states for pt to take for 28 days. Please advise. Thank you

## 2024-04-06 ENCOUNTER — Ambulatory Visit: Attending: Cardiology | Admitting: Cardiology

## 2024-04-06 VITALS — BP 123/88 | HR 89 | Ht <= 58 in | Wt 166.0 lb

## 2024-04-06 DIAGNOSIS — I428 Other cardiomyopathies: Secondary | ICD-10-CM | POA: Diagnosis not present

## 2024-04-06 DIAGNOSIS — I5022 Chronic systolic (congestive) heart failure: Secondary | ICD-10-CM | POA: Diagnosis not present

## 2024-04-06 DIAGNOSIS — I493 Ventricular premature depolarization: Secondary | ICD-10-CM

## 2024-04-06 DIAGNOSIS — I5020 Unspecified systolic (congestive) heart failure: Secondary | ICD-10-CM

## 2024-04-06 MED ORDER — SPIRONOLACTONE 25 MG PO TABS: 25.0000 mg | ORAL_TABLET | Freq: Every day | ORAL | 3 refills | Status: AC

## 2024-04-06 NOTE — Patient Instructions (Addendum)
 Medication Instructions:  Take Spironolactone  12.5 mg tablet (0.5 tablet) for 2-3 weeks, then if BP tolerates you can increase to 25 mg tablet (1 tablet).   *If you need a refill on your cardiac medications before your next appointment, please call your pharmacy*  Testing/Procedures:  Your physician has requested that you have an echocardiogram (in 3 months, before follow up). Echocardiography is a painless test that uses sound waves to create images of your heart. It provides your doctor with information about the size and shape of your heart and how well your heart's chambers and valves are working.   You may receive an ultrasound enhancing agent through an IV if needed to better visualize your heart during the echo. This procedure takes approximately one hour.  There are no restrictions for this procedure.  This will take place at [address] [facility]) [address], [place] [address]  Please note: We ask at that you not bring children with you during ultrasound (echo/ vascular) testing. Due to room size and safety concerns, children are not allowed in the ultrasound rooms during exams. Our front office staff cannot provide observation of children in our lobby area while testing is being conducted. An adult accompanying a patient to their appointment will only be allowed in the ultrasound room at the discretion of the ultrasound technician under special circumstances. We apologize for any inconvenience.   Follow-Up: At [facility], you and your health needs are our priority.  As part of our continuing mission to provide you with exceptional heart care, our providers are all part of one team.  This team includes your primary Cardiologist (physician) and Advanced Practice Providers or APPs (Physician Assistants and Nurse Practitioners) who all work together to provide you with the care you need, when you need it.  Your next appointment:   3 month(s) AFTER ECHO  Provider:    Jann Melody, MD     12 month follow up with Dr.Parker or Suzann Riddle, NP  We recommend signing up for the patient portal called "MyChart".  Sign up information is provided on this After Visit Summary.  MyChart is used to connect with patients for Virtual Visits (Telemedicine).  Patients are able to view lab/test results, encounter notes, upcoming appointments, etc.  Non-urgent messages can be sent to your provider as well.   To learn more about what you can do with MyChart, go to [email or web address].

## 2024-04-06 NOTE — Progress Notes (Signed)
 Electrophysiology Clinic Note    Date:  04/06/2024  Patient ID:  Maryan, Feehan [birth year], MRN [id number] PCP:  Claire Crick, MD  Cardiologist:  Jann Melody, MD Electrophysiologist: Ardeen Kohler, MD   Discussed the use of AI scribe software for clinical note transcription with the patient, who gave verbal consent to proceed.   Patient Profile    Chief Complaint: PVC ablation follow-up  History of Present Illness: DETRA POLIZZI is a 53 y.o. female with PMH notable for PVCs, NICM, HFmrEF; seen today for Ardeen Kohler, MD for routine electrophysiology followup.  She is s/p PVC ablation with successful mapping of RVOT on 03/08/2024 by Dr. Daneil Dunker. Prior to procedure, she would feel her palpitations while she was laying down at night, she has not felt them since procedure.  She has also noticed increased energy level, she is able to climb a flight of stairs without SOB.   She recently ran out of her spiro about the same time of her PVC ablation. Her BP readings have had elevated diastolic readings since her ablation, with readings in the high 80-90s. Prior to PVC ablation, diastolic was 60-70s     Arrhythmia/Device History No specialty comments available.     ROS:  Please see the history of present illness. All other systems are reviewed and otherwise negative.    Physical Exam    VS:  BP 123/88 (BP Location: Left Arm)   Pulse 89   Ht 4\' 10"  (1.473 m)   Wt 166 lb (75.3 kg)   SpO2 96%   BMI 34.69 kg/m  BMI: Body mass index is 34.69 kg/m.  Wt Readings from Last 3 Encounters:  04/06/24 166 lb (75.3 kg)  03/08/24 162 lb (73.5 kg)  12/23/23 168 lb 4.8 oz (76.3 kg)     GEN- The patient is well appearing, alert and oriented x 3 today.   Lungs- Clear to ausculation bilaterally, normal work of breathing.  Heart- Regular rate and rhythm, no murmurs, rubs or gallops Extremities- Trace peripheral edema, warm, dry. Bilateral groin sites c/d/I  without swelling or bruising    Studies Reviewed   Previous EP, cardiology notes.    EKG is ordered. Personal review of EKG from today shows:    EKG Interpretation Date/Time:  Tuesday Apr 06 2024 14:05:16 EDT Ventricular Rate:  89 PR Interval:  136 QRS Duration:  84 QT Interval:  380 QTC Calculation: 462 R Axis:   10  Text Interpretation: Normal sinus rhythm Confirmed by Mairely Foxworth [id number] on 04/06/2024 2:12:57 PM    Long term monitor, 11/03/2023 Patient had a minimum heart rate of 53 bpm, maximum heart rate of 164 bpm, and average heart rate of 95 bpm.  Predominant underlying rhythm was sinus rhythm.  Five shorts runs of NSVT (longest 5 beats).  Isolated PVCs were frequent (24%).  Isolated PACs were rare (<1.0%).  Triggered and diary events associated with sinus rhythm and sinus tachycardia.   Cardiac MRI, 10/03/2023 1.  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  2. Increase in T2 signal; no clear localizing imaging findings for PVCs.  3.  Dilated left atrium with mild valve regurgitation.       Assessment and Plan    #) PVCs S/p PVC ablation No PVCs on today's EKG or rhythm strip Continue 12.5mg  toprol  daily  #) HFmrEF Reduced LVEF thought to be d/t elevated PVC burden Update TTE in about 3 months GDMT - current medications: Entresto , Jardiance ,  and Toprol . Restart 12.5mg  spiro daily x 2-3 weeks while monitoring BP. If BP allows, increase to 25mg  spiro daily       Current medicines are reviewed at length with the patient today.   The patient does not have concerns regarding her medicines.  The following changes were made today:   RESTART 12.5mg  spiro  Labs/ tests ordered today include:  Orders Placed This Encounter  Procedures   EKG 12-Lead   ECHOCARDIOGRAM COMPLETE     Disposition: Follow up with Dr. Daneil Dunker or EP APP in 12 months   Signed, Savreen Gebhardt, NP  04/06/24  3:52 PM  Electrophysiology [facility]

## 2024-05-18 DIAGNOSIS — Z01419 Encounter for gynecological examination (general) (routine) without abnormal findings: Secondary | ICD-10-CM | POA: Diagnosis not present

## 2024-06-21 ENCOUNTER — Ambulatory Visit: Payer: Self-pay | Admitting: Cardiology

## 2024-06-21 ENCOUNTER — Ambulatory Visit: Attending: Cardiology

## 2024-06-21 DIAGNOSIS — I428 Other cardiomyopathies: Secondary | ICD-10-CM

## 2024-06-21 DIAGNOSIS — I5022 Chronic systolic (congestive) heart failure: Secondary | ICD-10-CM

## 2024-06-21 LAB — ECHOCARDIOGRAM COMPLETE
AR max vel: 2.2 cm2
AV Area VTI: 2.24 cm2
AV Area mean vel: 2.12 cm2
AV Mean grad: 3 mmHg
AV Peak grad: 6.1 mmHg
Ao pk vel: 1.23 m/s
Area-P 1/2: 4.6 cm2
S' Lateral: 3.92 cm

## 2024-06-23 ENCOUNTER — Ambulatory Visit: Admitting: Family Medicine

## 2024-06-28 ENCOUNTER — Telehealth: Payer: Self-pay | Admitting: Family Medicine

## 2024-06-28 ENCOUNTER — Other Ambulatory Visit (HOSPITAL_COMMUNITY): Payer: Self-pay

## 2024-06-28 ENCOUNTER — Ambulatory Visit: Admitting: Family Medicine

## 2024-06-28 ENCOUNTER — Telehealth: Payer: Self-pay

## 2024-06-28 VITALS — BP 122/78 | HR 81 | Temp 98.0°F | Ht <= 58 in | Wt 162.2 lb

## 2024-06-28 DIAGNOSIS — E041 Nontoxic single thyroid nodule: Secondary | ICD-10-CM | POA: Diagnosis not present

## 2024-06-28 DIAGNOSIS — E66811 Obesity, class 1: Secondary | ICD-10-CM | POA: Diagnosis not present

## 2024-06-28 DIAGNOSIS — I5022 Chronic systolic (congestive) heart failure: Secondary | ICD-10-CM

## 2024-06-28 MED ORDER — ZEPBOUND 2.5 MG/0.5ML ~~LOC~~ SOAJ: 2.5000 mg | SUBCUTANEOUS | 0 refills | Status: DC

## 2024-06-28 MED ORDER — ZEPBOUND 5 MG/0.5ML ~~LOC~~ SOAJ: 5.0000 mg | SUBCUTANEOUS | 3 refills | Status: DC

## 2024-06-28 NOTE — Telephone Encounter (Signed)
 Can we send request to pharmacy team to do PA for zepbound ? Thanks.

## 2024-06-28 NOTE — Telephone Encounter (Signed)
 Pharmacy Patient Advocate Encounter   Received notification from CoverMyMeds that prior authorization for Zepbound  5 is required/requested.   Insurance verification completed.   The patient is insured through [employer] .   Per test claim: PA required; PA submitted to above mentioned insurance via CoverMyMeds Key/confirmation #/EOC [id number] Status is pending

## 2024-06-28 NOTE — Assessment & Plan Note (Addendum)
 H/o multinodular goiter.  TSH remains normal.  Last thyroid  US  2019.  Consider updating thyroid  US  if GLP1RA/GIP started.  No fmhx MTC or MEN2

## 2024-06-28 NOTE — Patient Instructions (Addendum)
 Price out zepbound  sent to pharmacy 2.5mg  for first month then 5mg  weekly afterwards.  Let me know what insurance says.  If started, return in 4-6 weeks for weight management visit.  Good to see you today!

## 2024-06-28 NOTE — Telephone Encounter (Signed)
 Noted (see other messages by clicking blue '[address] [place]' link).

## 2024-06-28 NOTE — Progress Notes (Signed)
 Ph: [phone] Fax: [phone]   Patient ID: Jodi Young, female    DOB: February 14, 1971, 53 y.o.   MRN: [id number]  This visit was conducted in person.  BP 122/78   Pulse 81   Temp 98 F (36.7 C) (Oral)   Ht 4' 10 (1.473 m)   Wt 162 lb 4 oz (73.6 kg)   SpO2 100%   BMI 33.91 kg/m    CC: weight management  Subjective:   HPI: Jodi Young is a 53 y.o. 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 Continues spironolactone , Entresto , Toprol  XL, Jardiance .   H/o thyroid  nodules, previously followed by endo, latest 2008 thyroid  US .  Lab Results  Component Value Date   TSH 1.56 11/10/2023    Saw OBGYN - likely menopausal.   Has not previously been on bariatric medication.  Interested in [place].   Lab Results  Component Value Date   HGBA1C 5.5 11/11/2017     24 hour recall: 11:30am brunch - 1 toast with 1/2 egg and sausage, 4 oz OJ and coffee  3pm lunch - happy meal with hamburgers and small fries, small coke  7:30pm dinner - grilled salmon and collards with small portion rice, water   Activity regimen: Stationary bike 1 hour 3x/wk.       Relevant past medical, surgical, family and social history reviewed and updated as indicated. Interim medical history since our last visit reviewed. Allergies and medications reviewed and updated. Outpatient Medications Prior to Visit  Medication Sig Dispense Refill   empagliflozin  (JARDIANCE ) 10 MG TABS tablet Take 1 tablet (10 mg total) by mouth daily before breakfast. 90 tablet 3   metoprolol  succinate (TOPROL  XL) 25 MG 24 hr tablet Take 12.5 mg by mouth every evening.     sacubitril-valsartan (ENTRESTO ) 24-26 MG Take 1 tablet by mouth 2 (two)  times daily. 180 tablet 3   spironolactone  (ALDACTONE ) 25 MG tablet Take 1 tablet (25 mg total) by mouth daily. 90 tablet 3   No facility-administered medications prior to visit.     Per HPI unless specifically indicated in ROS section below Review of Systems  Objective:  BP 122/78   Pulse 81   Temp 98 F (36.7 C) (Oral)   Ht 4' 10 (1.473 m)   Wt 162 lb 4 oz (73.6 kg)   SpO2 100%   BMI 33.91 kg/m   Wt Readings from Last 3 Encounters:  06/28/24 162 lb 4 oz (73.6 kg)  04/06/24 166 lb (75.3 kg)  03/08/24 162 lb (73.5 kg)      Physical Exam Vitals and nursing note reviewed.  Constitutional:      Appearance: Normal appearance. She is not ill-appearing.  HENT:     Head: Normocephalic and atraumatic.     Mouth/Throat:     Mouth: Mucous membranes are moist.     Pharynx: Oropharynx is clear. No oropharyngeal exudate or posterior oropharyngeal erythema.  Eyes:     Extraocular Movements: Extraocular movements intact.     Conjunctiva/sclera: Conjunctivae normal.     Pupils: Pupils are equal, round, and reactive to light.  Neck:     Thyroid : Thyromegaly (R>L) present. No thyroid  mass or thyroid  tenderness.  Cardiovascular:     Rate  and Rhythm: Normal rate and regular rhythm.     Pulses: Normal pulses.     Heart sounds: Normal heart sounds. No murmur heard. Pulmonary:     Effort: Pulmonary effort is normal. No respiratory distress.     Breath sounds: Normal breath sounds. No wheezing, rhonchi or rales.  Musculoskeletal:     Cervical back: Normal range of motion and neck supple.     Right lower leg: Edema (tr) present.     Left lower leg: Edema (tr) present.  Skin:    General: Skin is warm and dry.     Findings: No rash.  Neurological:     Mental Status: She is alert.  Psychiatric:        Mood and Affect: Mood normal.        Behavior: Behavior normal.       Results for orders placed or performed in visit on 06/21/24  ECHOCARDIOGRAM COMPLETE   Collection Time:  06/21/24  7:55 AM  Result Value Ref Range   AR max vel 2.20 cm2   AV Peak grad 6.1 mmHg   Ao pk vel 1.23 m/s   S' Lateral 3.92 cm   Area-P 1/2 4.60 cm2   AV Area VTI 2.24 cm2   AV Mean grad 3.0 mmHg   AV Area mean vel 2.12 cm2   Est EF 40 - 45%     Assessment & Plan:   Problem List Items Addressed This Visit     Thyroid  nodule   H/o multinodular goiter.  TSH remains normal.  Last thyroid  US  2019.  Consider updating thyroid  US  if GLP1RA/GIP started.  No fmhx MTC or MEN2      Obesity, Class I, BMI 30-34.9 - Primary   24 hour food diary reviewed, recommendations provided Patient is interested in GLP1RA. Reviewed mechanism of action of medication as well as side effects and adverse events to watch for including nausea, diarrhea, constipation, pancreatitis. No fmhx medullary thyroid  cancer or MEN2. Discussed titration schedule for medication. Will start Zepbound  2.5mg  weekly for 1 month then 5mg  weekly. Discussed importance of strength training to maintain muscle mass if medication started.  Discussed need for regular visits for weight management to monitor medication effect and tolerance and weight loss, rec return 1 month after starting medication.       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 Will price out Zepbound  as per above - in setting of CHF.         Meds ordered this encounter  Medications   tirzepatide  (ZEPBOUND ) 2.5 MG/0.5ML Pen    Sig: Inject 2.5 mg into the skin once a week.    Dispense:  2 mL    Refill:  0    I50.20 indication   tirzepatide  (ZEPBOUND ) 5 MG/0.5ML Pen    Sig: Inject 5 mg into the skin once a week.    Dispense:  2 mL    Refill:  3    No orders of the defined types were placed in this encounter.   Patient Instructions  Price out zepbound  sent to pharmacy 2.5mg  for first month then 5mg  weekly afterwards.  Let me know what insurance says.  If  started, return in 4-6 weeks for weight management visit.  Good to see you today!  Follow up plan: No follow-ups on file.  Anton Blas, MD

## 2024-06-28 NOTE — Assessment & Plan Note (Signed)
 24 hour food diary reviewed, recommendations provided Patient is interested in GLP1RA. Reviewed mechanism of action of medication as well as side effects and adverse events to watch for including nausea, diarrhea, constipation, pancreatitis. No fmhx medullary thyroid  cancer or MEN2. Discussed titration schedule for medication. Will start Zepbound  2.5mg  weekly for 1 month then 5mg  weekly. Discussed importance of strength training to maintain muscle mass if medication started.  Discussed need for regular visits for weight management to monitor medication effect and tolerance and weight loss, rec return 1 month after starting medication.

## 2024-06-28 NOTE — Assessment & Plan Note (Signed)
 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 Will price out Zepbound  as per above - in setting of CHF.

## 2024-06-29 ENCOUNTER — Other Ambulatory Visit: Payer: Self-pay | Admitting: Family Medicine

## 2024-06-29 ENCOUNTER — Other Ambulatory Visit (HOSPITAL_COMMUNITY): Payer: Self-pay

## 2024-06-29 NOTE — Telephone Encounter (Signed)
 Pharmacy Patient Advocate Encounter  Received notification from [facility] that Prior Authorization for Zepbound  5 has been DENIED.  Full denial letter will be uploaded to the media tab. See denial reason below.   PA #/Case ID/Reference #: [id number]

## 2024-06-29 NOTE — Telephone Encounter (Signed)
 PA submitted (see 06/28/24 phn note).

## 2024-07-08 ENCOUNTER — Other Ambulatory Visit

## 2024-07-12 MED ORDER — CONTRAVE 8-90 MG PO TB12: ORAL_TABLET | ORAL | 0 refills | Status: DC

## 2024-07-12 NOTE — Addendum Note (Signed)
 Addended by: RILLA BALLER on: 07/12/2024 03:00 PM   Modules accepted: Orders

## 2024-07-12 NOTE — Telephone Encounter (Signed)
 Called patient reviewed all information and repeated back to me. Pt will call the office back like recommended.

## 2024-07-12 NOTE — Telephone Encounter (Signed)
 See phone note

## 2024-07-12 NOTE — Telephone Encounter (Signed)
 Contrave  sent for patient to price out.  Rec schedule OV 1 month after starting medication for f/u

## 2024-07-12 NOTE — Telephone Encounter (Addendum)
 Please notify insurance states she must first try 2 oral medicines for weight loss prior to considering zepbound .  However there are some oral medications that she shouldn't use in h/o heart issues.  Probably only option would be contrave  (wellbutrin + naltrexone) which is a combination antidepressant and opiate reversal agent. To let me know if desire to try this.   Alternatively she could try zepbound  through manufacturer direct pharmacy bypassing insurance ($350 for 25mg  dose and $490 fo5mg  vial dose).

## 2024-07-12 NOTE — Telephone Encounter (Signed)
 Called patient reviewed all information and repeated back to me. Will call if any questions.  She would like to try contrave . [facility]. Will reach out if any questions.

## 2024-07-13 NOTE — Telephone Encounter (Unsigned)
 Copied from CRM #[id number]. Topic: General - Other >> Jul 13, 2024 11:51 AM Burnard DEL wrote: Reason for CRM: Patient called in to let provider know that she doesn't want to do the contrave  medication,so theres no need to do PA.

## 2024-07-16 ENCOUNTER — Other Ambulatory Visit (HOSPITAL_COMMUNITY): Payer: Self-pay

## 2024-07-22 DIAGNOSIS — Z1231 Encounter for screening mammogram for malignant neoplasm of breast: Secondary | ICD-10-CM | POA: Diagnosis not present

## 2024-07-22 LAB — HM MAMMOGRAPHY

## 2024-07-31 ENCOUNTER — Other Ambulatory Visit: Payer: Self-pay | Admitting: Internal Medicine

## 2024-08-02 ENCOUNTER — Other Ambulatory Visit: Payer: Self-pay

## 2024-08-03 ENCOUNTER — Other Ambulatory Visit (HOSPITAL_COMMUNITY): Payer: Self-pay

## 2024-08-03 ENCOUNTER — Telehealth: Payer: Self-pay

## 2024-08-03 ENCOUNTER — Encounter: Payer: Self-pay | Admitting: Internal Medicine

## 2024-08-03 ENCOUNTER — Ambulatory Visit (INDEPENDENT_AMBULATORY_CARE_PROVIDER_SITE_OTHER)

## 2024-08-03 ENCOUNTER — Ambulatory Visit: Attending: Internal Medicine | Admitting: Internal Medicine

## 2024-08-03 VITALS — BP 108/78 | HR 101 | Resp 16 | Ht <= 58 in | Wt 168.6 lb

## 2024-08-03 DIAGNOSIS — I5020 Unspecified systolic (congestive) heart failure: Secondary | ICD-10-CM | POA: Diagnosis not present

## 2024-08-03 DIAGNOSIS — I420 Dilated cardiomyopathy: Secondary | ICD-10-CM

## 2024-08-03 DIAGNOSIS — I493 Ventricular premature depolarization: Secondary | ICD-10-CM

## 2024-08-03 MED ORDER — SACUBITRIL-VALSARTAN 24-26 MG PO TABS: 1.0000 | ORAL_TABLET | Freq: Two times a day (BID) | ORAL | 1 refills | Status: AC

## 2024-08-03 MED ORDER — FUROSEMIDE 20 MG PO TABS: 20.0000 mg | ORAL_TABLET | Freq: Every day | ORAL | 11 refills | Status: AC | PRN

## 2024-08-03 NOTE — Progress Notes (Signed)
 Cardiology Office Note:  .    Date:  08/03/2024  ID:  Bobbette LELON Metro, DOB [birth year], MRN [id number] PCP: Rilla Baller, MD  [facility] Providers Cardiologist:  Stanly DELENA Leavens, MD Electrophysiologist:  Fonda Kitty, MD     CC: HF f/u  History of Present Illness: .    Jodi Young is a 53 y.o. female  with heart failure and frequent PVCs who presents for follow-up after PVC ablation.  She has a history of heart failure with moderately reduced ejection fraction and frequent premature ventricular contractions (PVCs). 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 Since the procedure, she has experienced no palpitations, increased energy levels, and improvement in shortness of breath. Her diastolic blood pressures were previously elevated but have improved since resuming spironolactone .  Her current medication regimen includes Lotos, Entresto , Jardiance , metoprolol , and spironolactone . Previous cardiac MRI showed dilated cardiomyopathy with increased T2 signals but no late gadolinium enhancement.  She has been engaging in more exercise over the summer, tracking her heart rate during exercise and aiming for the 130s without experiencing shortness of breath. However, her exercise routine has slowed since returning to work in August. She occasionally experiences fluttering sensations at night, which she attributes to anxiety, especially since resuming work.  She has a history of swelling, particularly in her ankles, which she associates with her heart condition. She continues to consume caffeinated coffee and is actively reducing her sugar intake. She has inquired about weight loss medications.  Discussed the use of AI scribe software for clinical note transcription with the patient, who gave verbal consent to proceed.   Relevant histories: .  Social  - grandmother died from HF; no hx of SCD; has two children ROS: As  per HPI.   Studies Reviewed: .     Cardiac Studies & Procedures   ______________________________________________________________________________________________   STRESS TESTS  MYOCARDIAL PERFUSION IMAGING 09/12/2023  Interpretation Summary   LV perfusion is normal. There is no evidence of ischemia. There is no evidence of infarction.   Left ventricular function is abnormal. Global function is moderately reduced. There were no regional wall motion abnormalities. Nuclear stress EF: 35%. The left ventricular ejection fraction is moderately decreased (30-44%). End diastolic cavity size is normal.   Average exercise capacity (6:00 min:s; 7.0 METS). Normal HR/BP response to exercise. Frequent PVCs noted prior to the study and in recovery.   The study is normal. The study is intermediate risk.   ECHOCARDIOGRAM  ECHOCARDIOGRAM COMPLETE 06/21/2024  Narrative ECHOCARDIOGRAM REPORT    Patient Name:   Jodi Young Date of Exam: 06/21/2024 Medical Rec #:  [id number]         Height:       58.0 in Accession #:    [id number]        Weight:       166.0 lb Date of Birth:  December 01, 1970         BSA:          1.682 m Patient Age:    53 years          BP:           123/88 mmHg Patient Gender: F                 HR:           66 bpm. Exam Location:  [facility]  Procedure: 2D Echo, Cardiac Doppler, Color Doppler and Strain Analysis (Both  Spectral and Color Flow Doppler were utilized during procedure).  Indications:    I50.33 Acute on chronic diastolic (congestive) heart failure; I50.40* Unspecified combined systolic (congestive) and diastolic (congestive) heart failure  History:        Patient has prior history of Echocardiogram examinations, most recent 07/15/2023. CHF, Arrythmias:PVC, Signs/Symptoms:Chest Pain; Risk Factors:Hypertension.  Sonographer:    Doyal Point MHA, BS, RDCS Referring Phys: [id number] SUZANN RIDDLE  IMPRESSIONS   1. Left ventricular ejection fraction, by  estimation, is 40 to 45%. Left ventricular ejection fraction by PLAX is 34 %. The left ventricle has mild to moderately decreased function. The left ventricle demonstrates global hypokinesis. Left ventricular diastolic parameters were normal. The average left ventricular global longitudinal strain is -16.6 %. The global longitudinal strain is abnormal. 2. Right ventricular systolic function is normal. The right ventricular size is normal. 3. The mitral valve is normal in structure. Mild mitral valve regurgitation. 4. The aortic valve is tricuspid. Aortic valve regurgitation is not visualized. 5. The inferior vena cava is normal in size with <50% respiratory variability, suggesting right atrial pressure of 8 mmHg.  FINDINGS Left Ventricle: Left ventricular ejection fraction, by estimation, is 40 to 45%. Left ventricular ejection fraction by PLAX is 34 %. The left ventricle has mild to moderately decreased function. The left ventricle demonstrates global hypokinesis. The average left ventricular global longitudinal strain is -16.6 %. Strain was performed and the global longitudinal strain is abnormal. The left ventricular internal cavity size was normal in size. There is no left ventricular hypertrophy. Left ventricular diastolic parameters were normal.  Right Ventricle: The right ventricular size is normal. No increase in right ventricular wall thickness. Right ventricular systolic function is normal.  Left Atrium: Left atrial size was normal in size.  Right Atrium: Right atrial size was normal in size.  Pericardium: There is no evidence of pericardial effusion.  Mitral Valve: The mitral valve is normal in structure. Mild mitral valve regurgitation.  Tricuspid Valve: The tricuspid valve is normal in structure. Tricuspid valve regurgitation is mild.  Aortic Valve: The aortic valve is tricuspid. Aortic valve regurgitation is not visualized. Aortic valve mean gradient measures 3.0 mmHg. Aortic  valve peak gradient measures 6.1 mmHg. Aortic valve area, by VTI measures 2.24 cm.  Pulmonic Valve: The pulmonic valve was normal in structure. Pulmonic valve regurgitation is not visualized.  Aorta: The aortic root and ascending aorta are structurally normal, with no evidence of dilitation.  Venous: The inferior vena cava is normal in size with less than 50% respiratory variability, suggesting right atrial pressure of 8 mmHg.  IAS/Shunts: No atrial level shunt detected by color flow Doppler.   LEFT VENTRICLE PLAX 2D LV EF:         Left            Diastology ventricular     LV e' medial:    6.64 cm/s ejection        LV E/e' medial:  11.8 fraction by     LV e' lateral:   9.36 cm/s PLAX is 34      LV E/e' lateral: 8.3 %. LVIDd:         4.69 cm         2D Longitudinal LVIDs:         3.92 cm         Strain LV PW:         0.80 cm         2D Strain GLS   -  16.6 % LV IVS:        0.88 cm         Avg: LVOT diam:     2.00 cm LV SV:         58 LV SV Index:   35 LVOT Area:     3.14 cm   RIGHT VENTRICLE RV Basal diam:  3.08 cm RV Mid diam:    2.67 cm RV S prime:     11.70 cm/s TAPSE (M-mode): 2.0 cm  LEFT ATRIUM             Index        RIGHT ATRIUM           Index LA diam:        3.70 cm 2.20 cm/m   RA Area:     14.30 cm LA Vol (A2C):   67.0 ml 39.82 ml/m  RA Volume:   36.50 ml  21.69 ml/m LA Vol (A4C):   36.4 ml 21.64 ml/m LA Biplane Vol: 49.8 ml 29.60 ml/m AORTIC VALVE AV Area (Vmax):    2.20 cm AV Area (Vmean):   2.12 cm AV Area (VTI):     2.24 cm AV Vmax:           123.00 cm/s AV Vmean:          83.400 cm/s AV VTI:            0.260 m AV Peak Grad:      6.1 mmHg AV Mean Grad:      3.0 mmHg LVOT Vmax:         86.00 cm/s LVOT Vmean:        56.200 cm/s LVOT VTI:          0.185 m LVOT/AV VTI ratio: 0.71  AORTA Ao Sinus diam: 2.82 cm Ao Asc diam:   2.90 cm  MITRAL VALVE MV Area (PHT): 4.60 cm    SHUNTS MV Decel Time: 165 msec    Systemic VTI:  0.18 m MV E  velocity: 78.10 cm/s  Systemic Diam: 2.00 cm MV A velocity: 62.70 cm/s MV E/A ratio:  1.25  Redell Cave MD Electronically signed by Redell Cave MD Signature Date/Time: 06/21/2024/1:31:10 PM    Final    MONITORS  LONG TERM MONITOR (3-14 DAYS) 11/03/2023  Narrative   Patient had a minimum heart rate of 53 bpm, maximum heart rate of 164 bpm, and average heart rate of 95 bpm. Predominant underlying rhythm was sinus rhythm. Five shorts runs of NSVT (longest 5 beats). Isolated PVCs were frequent (24%). Isolated PACs were rare (<1.0%). Triggered and diary events associated with sinus rhythm and sinus tachycardia.  Frequent, asymptomatic PVCs.     CARDIAC MRI  MR CARDIAC MORPHOLOGY W WO CONTRAST 10/03/2023  Narrative CLINICAL DATA:  Clinical question of PVCs Study assumes HCT of 38 and BSA of 1.73 m2.  EXAM: CARDIAC MRI  TECHNIQUE: The patient was scanned on a 1.5 Tesla GE magnet. A dedicated cardiac coil was used. Functional imaging was done using Fiesta sequences. 2,3, and 4 chamber views were done to assess for RWMA's. Modified Simpson's rule using a short axis stack was used to calculate an ejection fraction on a dedicated work [employer]. The patient received 10 cc of Gadavist . After 10 minutes inversion recovery sequences were used to assess for infiltration and scar tissue. Flow quantification was performed 2 times during this examination with flow quantification performed at the levels of the ascending aorta  above the valve, pulmonary artery above the valve.  CONTRAST:  10 cc  of Gadavist   FINDINGS: 1. Mild to moderately dilated left ventricular size, with LVEDD 59 mm and LVEDV 175 mL but LVEDVi 101 mL/m2.  Normal left ventricular thickness  Mild decrease in left ventricular systolic function (LVEF =43%). There are no regional wall motion abnormalities but global hypokinesis.  Left ventricular parametric mapping notable for  normal ECV.  Increase in basal lateral T2 signal (63 ms)  There is no late gadolinium enhancement in the left ventricular myocardium.  2.  Mild increase in right ventricular size with RVEDVI 94 mL/m2.  Normal right ventricular thickness.  Normal right ventricular systolic function (RVEF =57%). There are no regional wall motion abnormalities or aneurysms.  3. Normal right atrial size. Dilated left atrium, indexed left atrium volume 47 mL/m2. In localization views, cannot exclude patent ductus arteriosus. Qp/Qs of 1.01 is not suggestive of this.  4. Normal size of the aortic root, ascending aorta and pulmonary artery.  5. Valve assessment:  Aortic Valve: Tri-leaflet aortic valve. There is no significant regurgitation.  Pulmonic Valve: There is no significant regurgitation.  Tricuspid Valve: There is mild regurgitation. Regurgitant fraction 9%.  Mitral Valve: There is mild regurgitation. Regurgitant fraction 9%.  6. Normal pericardium. Small basal pericardial effusion. No evidence of increased pericardial pressure.  7. Grossly, no extracardiac findings. Recommended dedicated study if concerned for non-cardiac pathology.  IMPRESSION: 1.  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  2. Increase in T2 signal; no clear localizing imaging findings for PVCs.  3.  Dilated left atrium with mild valve regurgitation.  Stanly Leavens MD   Electronically Signed By: Stanly Leavens M.D. On: 10/03/2023 13:48   ______________________________________________________________________________________________      Physical Exam:    VS:  BP 108/78 (BP Location: Left Arm, Patient Position: Sitting, Cuff Size: Large)   Pulse (!) 101   Resp 16   Ht 4' 10 (1.473 m)   Wt 168 lb 9.6 oz (76.5 kg)   SpO2 96%   BMI 35.24 kg/m    Wt Readings from Last 3 Encounters:  08/03/24 168 lb 9.6 oz (76.5 kg)  06/28/24 162 lb 4 oz (73.6 kg)  04/06/24 166 lb  (75.3 kg)    Gen: no distress  Neck: No JVD Cardiac: No Rubs or Gallops, no Murmur, +2 radial pulses   Heart rate of 70 increases to 110 that resolved with carotid massage  Respiratory: Clear to auscultation bilaterally, normal effort, normal  respiratory rate GI: Soft, nontender, non-distended  MS: No  edema;  moves all extremities Integument: Skin feels warm Neuro:  At time of evaluation, alert and oriented to person/place/time/situation  Psych: Normal affect, patient feels ok   ASSESSMENT AND PLAN: .    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 Dilated cardiomyopathy with increased T2 signals but no late gadolinium enhancement. Persistent lower extremity edema. Heart rate increased to 110 bpm during examination, resolved with carotid massage. No evidence of blockage or scar on MRI. Differential includes PVC-mediated cardiomyopathy, though heart function has not normalized post-ablation. - Order heart monitor for one week to assess for arrhythmias. - Prescribe Lasix  20 mg PRN for leg swelling, with instructions to avoid nighttime dosing to prevent nocturia. - Order cardiopulmonary exercise test to evaluate cardiometabolic status ) Consented for CPET; her Apple Peak VO2 is 20.9 - Consider referral to  heart failure specialist if cardiopulmonary exercise test results are concerning.  Premature ventricular contractions, status post ablation Status post PVC ablation for RVOT PVCs with improved symptoms and energy levels. No PVCs noted on recent EKG. Heart function remains mildly reduced despite ablation, raising concerns about underlying etiology. - Continue current goal-directed medical therapy including Entresto , Jardiance , metoprolol , and spironolactone . - Monitor heart rhythm with heart monitor for one week.  Planned genetic testing for familial dilated  cardiomyopathy Considering genetic testing for familial dilated cardiomyopathy due to unexplained cardiomyopathy and family history of heart failure. Insurance coverage for genetic testing is a concern, and testing may impact care and screening for her children. - Consult with clinical geneticist regarding potential for insurance coverage for genetic testing. - Proceed with genetic testing if insurance coverage is feasible.  GENETIC TESTING COUNSELING EVALUATION:  I met with the patient today to discuss cardiac genetic testing. We reviewed the following information:  INDICATION FOR TESTING: * Suspected/confirmed diagnosis of familial dilated cardiomyopathy * Family history of HF (grandmother) she has no clear etiology of her HF, and LVEF has not improved with RVOT PVC ablation  BENEFITS OF GENETIC TESTING DISCUSSED:  * May confirm or refine clinical diagnosis * May guide treatment decisions and medical management (based on testing and monitor we may consider inflammatory PET imaging) * May provide prognostic information * May identify at-risk family members who could benefit from screening (two daughters) * May avoid unnecessary testing or interventions for family members who test negative (Echo for kids at age 45)  LIMITATIONS AND RISKS DISCUSSED:  * Testing may not identify a genetic cause, even if one exists * Testing may identify a variant of uncertain significance (VUS) * Results may change over time as more information becomes available * Potential for unexpected or secondary findings * Possible psychological impact of positive or negative results * Possible discrimination concerns (life insurance)  FINANCIAL CONSIDERATIONS DISCUSSED:  * Insurance coverage and potential out-of-pocket costs * Prior authorization requirements  LIFE INSURANCE/DISABILITY INSURANCE IMPLICATIONS:  * GINA protection applies to health insurance and employment but not life, disability, or long-term care  insurance * Recommendation to consider securing insurance prior to testing if concerned * Documentation considerations for insurance applications  FAMILY IMPLICATIONS: * Cascade testing approach for relatives if positive result * Resources for family discussions  PATIENT UNDERSTANDING AND DECISION:  * Patient demonstrated understanding of the above information * Patient had opportunity to ask questions which were addressed * Patient decision: proceed with testing if genetic testing would be reasonably covered with Blue Cross Blue Shield  Reaching out to Dr. Fairy and will likely send consult based on our discussion  Time Spent Directly with Patient:   I have spent a total of 41 minutes with the patient reviewing notes, imaging, EKGs, labs, and examining the patient as well as establishing an assessment and plan that was discussed personally with the patient. Discussed disease state education,reviewed her CPET data from her Apple Watch, and discussed genetic testing.  Six months with my team.    Stanly Leavens, MD FASE [facility] Cardiologist [facility]  [address] [place], [address] [place], [place] [address] [phone]  5:39 PM

## 2024-08-03 NOTE — Telephone Encounter (Signed)
 Pharmacy Patient Advocate Encounter   Received notification from Onbase that prior authorization for Contrave  8-90mg  ER tabs is required/requested.   Insurance verification completed.   The patient is insured through [facility] .   Per test claim: PA required; PA submitted to above mentioned insurance via Latent Key/confirmation #/EOC [id number] Status is pending

## 2024-08-03 NOTE — Patient Instructions (Signed)
 Medication Instructions:  Your physician has recommended you make the following change in your medication:  START: furosemide  (Lasix ) 20 mg by mouth once daily as needed for leg swelling  *If you need a refill on your cardiac medications before your next appointment, please call your pharmacy*  Lab Work: NONE  If you have labs (blood work) drawn today and your tests are completely normal, you will receive your results only by: MyChart Message (if you have MyChart) OR A paper copy in the mail If you have any lab test that is abnormal or we need to change your treatment, we will call you to review the results.  Testing/Procedures: Your physician has requested that you wear a heart monitor.   Your physician has recommended that you have a cardiopulmonary stress test (CPX). CPX testing is a non-invasive measurement of heart and lung function. It replaces a traditional treadmill stress test. This type of test provides a tremendous amount of information that relates not only to your present condition but also for future outcomes. This test combines measurements of you ventilation, respiratory gas exchange in the lungs, electrocardiogram (EKG), blood pressure and physical response before, during, and following an exercise protocol.   Follow-Up: At [facility], you and your health needs are our priority.  As part of our continuing mission to provide you with exceptional heart care, our providers are all part of one team.  This team includes your primary Cardiologist (physician) and Advanced Practice Providers or APPs (Physician Assistants and Nurse Practitioners) who all work together to provide you with the care you need, when you need it.  Your next appointment:   6 month(s)  Provider:   Stanly DELENA Leavens, MD or Jon Hails, PA-C, Dayna Dunn, PA-C, Callie Goodrich, PA-C, Glendia Ferrier, PA-C, or Katlyn West, NP       Other Instructions GEOFFRY HEWS- Long Term Monitor  Instructions  Your physician has requested you wear a ZIO patch monitor for 7 days.  This is a single patch monitor. [employer] supplies one patch monitor per enrollment. Additional stickers are not available. Please do not apply patch if you will be having a Nuclear Stress Test,  Echocardiogram, Cardiac CT, MRI, or Chest Xray during the period you would be wearing the  monitor. The patch cannot be worn during these tests. You cannot remove and re-apply the  ZIO XT patch monitor.  Your ZIO patch monitor will be mailed 3 day USPS to your address on file. It may take 3-5 days  to receive your monitor after you have been enrolled.  Once you have received your monitor, please review the enclosed instructions. Your monitor  has already been registered assigning a specific monitor serial # to you.  Billing and Patient Assistance Program Information  We have supplied [employer] with any of your insurance information on file for billing purposes. [employer] offers a sliding scale Patient Assistance Program for patients that do not have  insurance, or whose insurance does not completely cover the cost of the ZIO monitor.  You must apply for the Patient Assistance Program to qualify for this discounted rate.  To apply, please call [employer] at [phone], select option 4, select option 2, ask to apply for  Patient Assistance Program. Meredeth will ask your household income, and how many people  are in your household. They will quote your out-of-pocket cost based on that information.  [employer] will also be able to set up a 28-month, interest-free payment plan if needed.  Applying the  monitor   Shave hair from upper left chest.  Hold abrader disc by orange tab. Rub abrader in 40 strokes over the upper left chest as  indicated in your monitor instructions.  Clean area with 4 enclosed alcohol pads. Let dry.  Apply patch as indicated in monitor instructions. Patch will be placed under collarbone on left  side  of chest with arrow pointing upward.  Rub patch adhesive wings for 2 minutes. Remove white label marked 1. Remove the white  label marked 2. Rub patch adhesive wings for 2 additional minutes.  While looking in a mirror, press and release button in center of patch. A small green light will  flash 3-4 times. This will be your only indicator that the monitor has been turned on.  Do not shower for the first 24 hours. You may shower after the first 24 hours.  Press the button if you feel a symptom. You will hear a small click. Record Date, Time and  Symptom in the Patient Logbook.  When you are ready to remove the patch, follow instructions on the last 2 pages of Patient  Logbook. Stick patch monitor onto the last page of Patient Logbook.  Place Patient Logbook in the blue and white box. Use locking tab on box and tape box closed  securely. The blue and white box has prepaid postage on it. Please place it in the mailbox as  soon as possible. Your physician should have your test results approximately 7 days after the  monitor has been mailed back to [facility] ([place]  Call [facility] Customer Care at [phone] if you have questions regarding  your ZIO XT patch monitor. Call them immediately if you see an orange light blinking on your  monitor.  If your monitor falls off in less than 4 days, contact our Monitor department at [phone].  If your monitor becomes loose or falls off after 4 days call [employer] at [phone] for  suggestions on securing your monitor

## 2024-08-03 NOTE — Progress Notes (Unsigned)
 Enrolled patient for a 7 day Zio XT monitor to be mailed to patients home.

## 2024-08-04 ENCOUNTER — Other Ambulatory Visit (HOSPITAL_COMMUNITY): Payer: Self-pay

## 2024-08-04 MED ORDER — EMPAGLIFLOZIN 10 MG PO TABS: 10.0000 mg | ORAL_TABLET | Freq: Every day | ORAL | 3 refills | Status: AC

## 2024-08-04 NOTE — Telephone Encounter (Signed)
 Pharmacy Patient Advocate Encounter  Received notification from [facility] that Prior Authorization for Contrave  8-90 mg ER tabs has been DENIED.  Full denial letter will be uploaded to the media tab. See denial reason below.     PA #/Case ID/Reference #: [id number]

## 2024-08-05 ENCOUNTER — Telehealth: Payer: Self-pay | Admitting: Pharmacy Technician

## 2024-08-05 NOTE — Telephone Encounter (Signed)
 Pharmacy Patient Advocate Encounter  Received notification from [facility] that Prior Authorization for jardiance  10mg  has been APPROVED from 08/05/24 to 08/05/25   PA #/Case ID/Reference #: [id number]

## 2024-08-05 NOTE — Telephone Encounter (Signed)
   Pharmacy Patient Advocate Encounter   Received notification from CoverMyMeds that prior authorization for jardiance  10mg  is required/requested.   Insurance verification completed.   The patient is insured through [facility] .   Per test claim: PA required; PA submitted to above mentioned insurance via Latent Key/confirmation #/EOC [id number] Status is pending

## 2024-08-26 ENCOUNTER — Encounter: Payer: Self-pay | Admitting: Internal Medicine

## 2024-08-27 ENCOUNTER — Ambulatory Visit: Payer: Self-pay | Admitting: Internal Medicine

## 2024-08-27 DIAGNOSIS — I420 Dilated cardiomyopathy: Secondary | ICD-10-CM | POA: Diagnosis not present

## 2024-08-27 DIAGNOSIS — I5020 Unspecified systolic (congestive) heart failure: Secondary | ICD-10-CM

## 2024-08-27 DIAGNOSIS — I493 Ventricular premature depolarization: Secondary | ICD-10-CM

## 2024-08-27 NOTE — Progress Notes (Signed)
 The patient has been notified of the result and verbalized understanding. All questions (if any) were answered.     Increase metoprolol  dose to 25 mg PO daily.  Sent the Genetic testing referral

## 2024-11-12 ENCOUNTER — Other Ambulatory Visit: Payer: Self-pay | Admitting: Family Medicine

## 2024-11-12 DIAGNOSIS — E041 Nontoxic single thyroid nodule: Secondary | ICD-10-CM

## 2024-11-12 DIAGNOSIS — E66811 Obesity, class 1: Secondary | ICD-10-CM

## 2024-11-12 DIAGNOSIS — D509 Iron deficiency anemia, unspecified: Secondary | ICD-10-CM

## 2024-11-12 DIAGNOSIS — E559 Vitamin D deficiency, unspecified: Secondary | ICD-10-CM

## 2024-11-15 ENCOUNTER — Other Ambulatory Visit: Payer: Federal, State, Local not specified - PPO

## 2024-11-15 DIAGNOSIS — E66811 Obesity, class 1: Secondary | ICD-10-CM | POA: Diagnosis not present

## 2024-11-15 DIAGNOSIS — E559 Vitamin D deficiency, unspecified: Secondary | ICD-10-CM | POA: Diagnosis not present

## 2024-11-15 DIAGNOSIS — D509 Iron deficiency anemia, unspecified: Secondary | ICD-10-CM

## 2024-11-15 DIAGNOSIS — E041 Nontoxic single thyroid nodule: Secondary | ICD-10-CM | POA: Diagnosis not present

## 2024-11-15 LAB — CBC WITH DIFFERENTIAL/PLATELET
Basophils Absolute: 0 K/uL (ref 0.0–0.1)
Basophils Relative: 0.5 % (ref 0.0–3.0)
Eosinophils Absolute: 0.1 K/uL (ref 0.0–0.7)
Eosinophils Relative: 1.8 % (ref 0.0–5.0)
HCT: 41.5 % (ref 36.0–46.0)
Hemoglobin: 13.5 g/dL (ref 12.0–15.0)
Lymphocytes Relative: 48.4 % — ABNORMAL HIGH (ref 12.0–46.0)
Lymphs Abs: 2.7 K/uL (ref 0.7–4.0)
MCHC: 32.6 g/dL (ref 30.0–36.0)
MCV: 81.1 fl (ref 78.0–100.0)
Monocytes Absolute: 0.4 K/uL (ref 0.1–1.0)
Monocytes Relative: 6.8 % (ref 3.0–12.0)
Neutro Abs: 2.4 K/uL (ref 1.4–7.7)
Neutrophils Relative %: 42.5 % — ABNORMAL LOW (ref 43.0–77.0)
Platelets: 195 K/uL (ref 150.0–400.0)
RBC: 5.11 Mil/uL (ref 3.87–5.11)
RDW: 13.7 % (ref 11.5–15.5)
WBC: 5.6 K/uL (ref 4.0–10.5)

## 2024-11-15 LAB — COMPREHENSIVE METABOLIC PANEL WITH GFR
ALT: 11 U/L (ref 3–35)
AST: 12 U/L (ref 5–37)
Albumin: 4.3 g/dL (ref 3.5–5.2)
Alkaline Phosphatase: 84 U/L (ref 39–117)
BUN: 8 mg/dL (ref 6–23)
CO2: 30 meq/L (ref 19–32)
Calcium: 9.6 mg/dL (ref 8.4–10.5)
Chloride: 105 meq/L (ref 96–112)
Creatinine, Ser: 0.82 mg/dL (ref 0.40–1.20)
GFR: 81.96 mL/min
Glucose, Bld: 97 mg/dL (ref 70–99)
Potassium: 4.2 meq/L (ref 3.5–5.1)
Sodium: 142 meq/L (ref 135–145)
Total Bilirubin: 0.5 mg/dL (ref 0.2–1.2)
Total Protein: 7.3 g/dL (ref 6.0–8.3)

## 2024-11-15 LAB — VITAMIN D 25 HYDROXY (VIT D DEFICIENCY, FRACTURES): VITD: 23.46 ng/mL — ABNORMAL LOW (ref 30.00–100.00)

## 2024-11-15 LAB — LIPID PANEL
Cholesterol: 168 mg/dL (ref 28–200)
HDL: 48.6 mg/dL
LDL Cholesterol: 98 mg/dL (ref 10–99)
NonHDL: 118.92
Total CHOL/HDL Ratio: 3
Triglycerides: 106 mg/dL (ref 10.0–149.0)
VLDL: 21.2 mg/dL (ref 0.0–40.0)

## 2024-11-15 LAB — IBC PANEL
Iron: 84 ug/dL (ref 42–145)
Saturation Ratios: 22.7 % (ref 20.0–50.0)
TIBC: 369.6 ug/dL (ref 250.0–450.0)
Transferrin: 264 mg/dL (ref 212.0–360.0)

## 2024-11-15 LAB — TSH: TSH: 2.95 u[IU]/mL (ref 0.35–5.50)

## 2024-11-15 LAB — FERRITIN: Ferritin: 49.2 ng/mL (ref 10.0–291.0)

## 2024-11-16 ENCOUNTER — Ambulatory Visit: Payer: Self-pay | Admitting: Family Medicine

## 2024-11-22 ENCOUNTER — Ambulatory Visit (INDEPENDENT_AMBULATORY_CARE_PROVIDER_SITE_OTHER): Payer: Federal, State, Local not specified - PPO | Admitting: Family Medicine

## 2024-11-22 ENCOUNTER — Encounter: Payer: Self-pay | Admitting: Family Medicine

## 2024-11-22 VITALS — BP 102/72 | HR 82 | Temp 98.1°F | Ht <= 58 in | Wt 164.4 lb

## 2024-11-22 DIAGNOSIS — Z531 Procedure and treatment not carried out because of patient's decision for reasons of belief and group pressure: Secondary | ICD-10-CM

## 2024-11-22 DIAGNOSIS — I5022 Chronic systolic (congestive) heart failure: Secondary | ICD-10-CM | POA: Diagnosis not present

## 2024-11-22 DIAGNOSIS — Z0000 Encounter for general adult medical examination without abnormal findings: Secondary | ICD-10-CM | POA: Diagnosis not present

## 2024-11-22 DIAGNOSIS — Z23 Encounter for immunization: Secondary | ICD-10-CM

## 2024-11-22 DIAGNOSIS — E559 Vitamin D deficiency, unspecified: Secondary | ICD-10-CM

## 2024-11-22 DIAGNOSIS — I10 Essential (primary) hypertension: Secondary | ICD-10-CM

## 2024-11-22 DIAGNOSIS — D509 Iron deficiency anemia, unspecified: Secondary | ICD-10-CM | POA: Diagnosis not present

## 2024-11-22 DIAGNOSIS — E66811 Obesity, class 1: Secondary | ICD-10-CM

## 2024-11-22 NOTE — Progress Notes (Signed)
 " Ph: [phone] Fax: [phone]   Patient ID: Jodi Young, female    DOB: 12-11-1970, 53 y.o.   MRN: [id number]  This visit was conducted in person.  BP 102/72 (BP Location: Right Arm, Patient Position: Sitting, Cuff Size: Normal)   Pulse 82   Temp 98.1 F (36.7 C) (Oral)   Ht 4' 9.68 (1.465 m)   Wt 164 lb 6.4 oz (74.6 kg)   SpO2 96%   BMI 34.75 kg/m   BP Readings from Last 3 Encounters:  11/22/24 102/72  08/03/24 108/78  06/28/24 122/78   CC: CPE Subjective:   HPI: ARDYS HATAWAY is a 53 y.o. 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 Continues spironolactone , Entresto , Toprol  XL, Jardiance .   Insurance did not cover tirzepatide , even for CHF indication.   Preventative: Colonoscopy 06/2020 WNL, rpt 5 yrs given fmhx Oma) Well woman with OBGYN Dr. Rutherford yearly, last seen 04/2024 Mammogram -normal at @ [facility] 06/2024 - will request records  LMP 07/16/2023 - menopausal  Birth control: s/p BTL  Lung cancer screening - not eliglbe  Flu shot yearly COVID vaccine - Pfizer 01/2020 x2, bivalent booster 08/2021 (will send us  date) Tdap 2013, 2023 Shingrix  - 05/2022, 09/2022 Advanced directive received, scanned into chart 08/2019. HCPOA are Ubaldo Young then Lorrene and Montie Pouch. Does NOT want transfusion of whole blood, red cells, white cells, platelets or plasma under any circumstance.  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 Interim medical history since our last visit reviewed. Allergies and medications reviewed and updated. Outpatient Medications Prior to Visit  Medication Sig Dispense Refill   empagliflozin  (JARDIANCE ) 10 MG TABS tablet Take 1 tablet (10 mg total) by mouth daily before breakfast. 90 tablet 3   furosemide  (LASIX ) 20 MG tablet Take 1 tablet (20 mg total) by mouth daily as needed for edema (leg swelling). 30 tablet 11   metoprolol  succinate (TOPROL  XL) 25 MG 24 hr tablet Take 25 mg by mouth every evening.     sacubitril -valsartan  (ENTRESTO ) 24-26 MG Take 1 tablet by mouth 2 (two) times daily. 180 tablet 1   spironolactone  (ALDACTONE ) 25 MG tablet Take 1 tablet (25 mg total) by mouth daily. 90 tablet 3   Naltrexone-buPROPion HCl ER (CONTRAVE ) 8-90 MG TB12 Start 1 tablet every morning for 7 days, then 1 tablet twice daily for 7 days, then 2 tablets every morning and one every evening (Patient not taking: Reported on 11/22/2024) 120 tablet 0   No facility-administered medications prior to visit.     Per HPI unless specifically indicated in ROS section below Review of  Systems  Constitutional:  Negative for activity change, appetite change, chills, fatigue, fever and unexpected weight change.  HENT:  Negative for hearing loss.   Eyes:  Negative for visual disturbance.  Respiratory:  Positive for chest tightness (occ). Negative for cough, shortness of breath and wheezing.   Cardiovascular:  Positive for leg swelling (occ). Negative for chest pain and palpitations.  Gastrointestinal:  Negative for abdominal distention, abdominal pain, blood in stool,  constipation, diarrhea, nausea and vomiting.  Genitourinary:  Negative for difficulty urinating and hematuria.  Musculoskeletal:  Negative for arthralgias, myalgias and neck pain.  Skin:  Negative for rash.  Neurological:  Negative for dizziness, seizures, syncope and headaches.  Hematological:  Negative for adenopathy. Does not bruise/bleed easily.  Psychiatric/Behavioral:  Negative for dysphoric mood. The patient is not nervous/anxious.     Objective:  BP 102/72 (BP Location: Right Arm, Patient Position: Sitting, Cuff Size: Normal)   Pulse 82   Temp 98.1 F (36.7 C) (Oral)   Ht 4' 9.68 (1.465 m)   Wt 164 lb 6.4 oz (74.6 kg)   SpO2 96%   BMI 34.75 kg/m   Wt Readings from Last 3 Encounters:  11/22/24 164 lb 6.4 oz (74.6 kg)  08/03/24 168 lb 9.6 oz (76.5 kg)  06/28/24 162 lb 4 oz (73.6 kg)      Physical Exam Vitals and nursing note reviewed.  Constitutional:      Appearance: Normal appearance. She is not ill-appearing.  HENT:     Head: Normocephalic and atraumatic.     Right Ear: Tympanic membrane, ear canal and external ear normal. There is no impacted cerumen.     Left Ear: Tympanic membrane, ear canal and external ear normal. There is no impacted cerumen.     Mouth/Throat:     Mouth: Mucous membranes are moist.     Pharynx: Oropharynx is clear. No oropharyngeal exudate or posterior oropharyngeal erythema.  Eyes:     General:        Right eye: No discharge.        Left eye: No discharge.     Extraocular Movements: Extraocular movements intact.     Conjunctiva/sclera: Conjunctivae normal.     Pupils: Pupils are equal, round, and reactive to light.  Neck:     Thyroid : No thyroid  mass or thyromegaly.     Vascular: No carotid bruit.  Cardiovascular:     Rate and Rhythm: Normal rate and regular rhythm.     Pulses: Normal pulses.     Heart sounds: Normal heart sounds. No murmur heard. Pulmonary:     Effort: Pulmonary effort is normal. No respiratory distress.      Breath sounds: Normal breath sounds. No wheezing, rhonchi or rales.  Abdominal:     General: Bowel sounds are normal. There is no distension.     Palpations: Abdomen is soft. There is no mass.     Tenderness: There is no abdominal tenderness. There is no guarding or rebound.     Hernia: No hernia is present.  Musculoskeletal:     Cervical back: Normal range of motion and neck supple. No rigidity.     Right lower leg: No edema.     Left lower leg: No edema.  Lymphadenopathy:     Cervical: No cervical adenopathy.  Skin:    General: Skin is warm and dry.     Findings: No rash.  Neurological:     General: No focal deficit present.     Mental Status: She  is alert. Mental status is at baseline.  Psychiatric:        Mood and Affect: Mood normal.        Behavior: Behavior normal.       Results for orders placed or performed in visit on 11/15/24  VITAMIN D  25 Hydroxy (Vit-D Deficiency, Fractures)   Collection Time: 11/15/24  7:30 AM  Result Value Ref Range   VITD 23.46 (L) 30.00 - 100.00 ng/mL  IBC panel   Collection Time: 11/15/24  7:30 AM  Result Value Ref Range   Iron 84 42 - 145 ug/dL   Transferrin 735.9 787.9 - 360.0 mg/dL   Saturation Ratios 77.2 20.0 - 50.0 %   TIBC 369.6 250.0 - 450.0 mcg/dL  Ferritin   Collection Time: 11/15/24  7:30 AM  Result Value Ref Range   Ferritin 49.2 10.0 - 291.0 ng/mL  CBC with Differential/Platelet   Collection Time: 11/15/24  7:30 AM  Result Value Ref Range   WBC 5.6 4.0 - 10.5 K/uL   RBC 5.11 3.87 - 5.11 Mil/uL   Hemoglobin 13.5 12.0 - 15.0 g/dL   HCT 58.4 63.9 - 53.9 %   MCV 81.1 78.0 - 100.0 fl   MCHC 32.6 30.0 - 36.0 g/dL   RDW 86.2 88.4 - 84.4 %   Platelets 195.0 150.0 - 400.0 K/uL   Neutrophils Relative % 42.5 (L) 43.0 - 77.0 %   Lymphocytes Relative 48.4 (H) 12.0 - 46.0 %   Monocytes Relative 6.8 3.0 - 12.0 %   Eosinophils Relative 1.8 0.0 - 5.0 %   Basophils Relative 0.5 0.0 - 3.0 %   Neutro Abs 2.4 1.4 - 7.7 K/uL   Lymphs  Abs 2.7 0.7 - 4.0 K/uL   Monocytes Absolute 0.4 0.1 - 1.0 K/uL   Eosinophils Absolute 0.1 0.0 - 0.7 K/uL   Basophils Absolute 0.0 0.0 - 0.1 K/uL  TSH   Collection Time: 11/15/24  7:30 AM  Result Value Ref Range   TSH 2.95 0.35 - 5.50 uIU/mL  Comprehensive metabolic panel with GFR   Collection Time: 11/15/24  7:30 AM  Result Value Ref Range   Sodium 142 135 - 145 mEq/L   Potassium 4.2 3.5 - 5.1 mEq/L   Chloride 105 96 - 112 mEq/L   CO2 30 19 - 32 mEq/L   Glucose, Bld 97 70 - 99 mg/dL   BUN 8 6 - 23 mg/dL   Creatinine, Ser 9.17 0.40 - 1.20 mg/dL   Total Bilirubin 0.5 0.2 - 1.2 mg/dL   Alkaline Phosphatase 84 39 - 117 U/L   AST 12 5 - 37 U/L   ALT 11 3 - 35 U/L   Total Protein 7.3 6.0 - 8.3 g/dL   Albumin 4.3 3.5 - 5.2 g/dL   GFR 18.03 >39.99 mL/min   Calcium 9.6 8.4 - 10.5 mg/dL  Lipid panel   Collection Time: 11/15/24  7:30 AM  Result Value Ref Range   Cholesterol 168 28 - 200 mg/dL   Triglycerides 893.9 89.9 - 149.0 mg/dL   HDL 51.39 >60.99 mg/dL   VLDL 78.7 0.0 - 59.9 mg/dL   LDL Cholesterol 98 10 - 99 mg/dL   Total CHOL/HDL Ratio 3    NonHDL 118.92     Assessment & Plan:   Problem List Items Addressed This Visit     Healthcare maintenance - Primary (Chronic)   Preventative protocols reviewed and updated unless pt declined. Discussed healthy diet and lifestyle.  No transfusions per religious beliefs (Chronic)   Anemia, iron deficiency   Anemia, iron deficiency largely resolved.       Vitamin D  deficiency   Not on replacement Rec restart vit D 1000 units daily or 5000 units weekly      Hypertension   Chronic, great control on current regimen.       Obesity, Class I, BMI 30-34.9   Reviewed healthy diet and lifestyle changes to effect sustainable weight loss.        Chronic heart failure with mildly reduced ejection fraction (HFmrEF, 41-49%) (HCC)   Chronic CHF, appreciate cardiology care.       Other Visit Diagnoses       Need for  vaccination against Streptococcus pneumoniae       Relevant Orders   Pneumococcal conjugate vaccine 20-valent (Prevnar 20) (Completed)        No orders of the defined types were placed in this encounter.   Orders Placed This Encounter  Procedures   Pneumococcal conjugate vaccine 20-valent (Prevnar 20)    Patient Instructions  We will request latest mammogram from [facility].  Prevnar-20 today.  Good to see you today Return as needed or in 1 year for next physical   Follow up plan: Return in about 1 year (around 11/22/2025) for annual exam, prior fasting for blood work.  Anton Blas, MD   "

## 2024-11-22 NOTE — Assessment & Plan Note (Signed)
 Chronic CHF, appreciate cardiology care.

## 2024-11-22 NOTE — Assessment & Plan Note (Signed)
 Reviewed healthy diet and lifestyle changes to effect sustainable weight loss.

## 2024-11-22 NOTE — Assessment & Plan Note (Signed)
 Not on replacement Rec restart vit D 1000 units daily or 5000 units weekly

## 2024-11-22 NOTE — Patient Instructions (Addendum)
 We will request latest mammogram from [facility].  Prevnar-20 today.  Good to see you today Return as needed or in 1 year for next physical

## 2024-11-22 NOTE — Assessment & Plan Note (Signed)
 Preventative protocols reviewed and updated unless pt declined. Discussed healthy diet and lifestyle.

## 2024-11-22 NOTE — Assessment & Plan Note (Signed)
 Anemia, iron deficiency largely resolved.

## 2024-11-22 NOTE — Assessment & Plan Note (Signed)
Chronic, great control on current regimen.  

## 2024-11-24 ENCOUNTER — Ambulatory Visit: Payer: Self-pay | Admitting: Family Medicine

## 2025-11-16 ENCOUNTER — Other Ambulatory Visit

## 2025-11-23 ENCOUNTER — Encounter: Admitting: Family Medicine
